# Patient Record
Sex: Female | Born: 1980 | Race: Black or African American | Hispanic: No | Marital: Single | Smoking: Current every day smoker
Health system: Southern US, Community
[De-identification: ages and names within clinical notes are randomized; demographics above are authoritative.]

## PROBLEM LIST (undated history)

## (undated) DIAGNOSIS — R569 Unspecified convulsions: Secondary | ICD-10-CM

## (undated) HISTORY — DX: Unspecified convulsions: R56.9

## (undated) HISTORY — PX: HERNIA REPAIR: SHX51

## (undated) HISTORY — PX: THYROID SURGERY: SHX805

## (undated) HISTORY — PX: TUBAL LIGATION: SHX77

---

## 2004-07-06 ENCOUNTER — Ambulatory Visit: Payer: Self-pay | Admitting: Surgery

## 2004-08-27 ENCOUNTER — Emergency Department: Payer: Self-pay | Admitting: Emergency Medicine

## 2005-09-18 ENCOUNTER — Emergency Department: Payer: Self-pay | Admitting: Emergency Medicine

## 2007-06-22 ENCOUNTER — Emergency Department: Payer: Self-pay | Admitting: Emergency Medicine

## 2007-09-03 ENCOUNTER — Emergency Department: Payer: Self-pay | Admitting: Emergency Medicine

## 2008-04-13 ENCOUNTER — Emergency Department: Payer: Self-pay | Admitting: Emergency Medicine

## 2008-08-11 ENCOUNTER — Emergency Department: Payer: Self-pay | Admitting: Emergency Medicine

## 2008-08-11 ENCOUNTER — Ambulatory Visit: Payer: Self-pay | Admitting: Family Medicine

## 2008-08-17 ENCOUNTER — Ambulatory Visit: Payer: Self-pay | Admitting: Obstetrics and Gynecology

## 2008-08-19 ENCOUNTER — Ambulatory Visit: Payer: Self-pay | Admitting: Family Medicine

## 2008-08-19 ENCOUNTER — Encounter: Payer: Self-pay | Admitting: Obstetrics & Gynecology

## 2008-08-19 LAB — CONVERTED CEMR LAB
Antibody Screen: NEGATIVE
Eosinophils Relative: 0 % (ref 0–5)
Hgb F Quant: 0 % (ref 0.0–2.0)
MCHC: 35.3 g/dL (ref 30.0–36.0)
Monocytes Absolute: 0.7 10*3/uL (ref 0.1–1.0)
RBC: 4.99 M/uL (ref 3.87–5.11)
Rh Type: NEGATIVE

## 2008-08-20 ENCOUNTER — Ambulatory Visit (HOSPITAL_COMMUNITY): Admission: RE | Admit: 2008-08-20 | Discharge: 2008-08-20 | Payer: Self-pay | Admitting: Obstetrics & Gynecology

## 2008-09-02 ENCOUNTER — Encounter: Payer: Self-pay | Admitting: Obstetrics & Gynecology

## 2008-09-02 ENCOUNTER — Encounter: Payer: Self-pay | Admitting: Obstetrics and Gynecology

## 2008-09-02 ENCOUNTER — Ambulatory Visit: Payer: Self-pay | Admitting: Obstetrics and Gynecology

## 2008-09-02 ENCOUNTER — Other Ambulatory Visit: Admission: RE | Admit: 2008-09-02 | Discharge: 2008-09-02 | Payer: Self-pay | Admitting: Obstetrics and Gynecology

## 2008-09-02 LAB — CONVERTED CEMR LAB: Trich, Wet Prep: NONE SEEN

## 2008-09-30 ENCOUNTER — Ambulatory Visit: Payer: Self-pay | Admitting: Family Medicine

## 2008-10-28 ENCOUNTER — Encounter: Payer: Self-pay | Admitting: Obstetrics & Gynecology

## 2008-10-28 ENCOUNTER — Ambulatory Visit: Payer: Self-pay | Admitting: Obstetrics and Gynecology

## 2008-11-11 ENCOUNTER — Ambulatory Visit (HOSPITAL_COMMUNITY): Admission: RE | Admit: 2008-11-11 | Discharge: 2008-11-11 | Payer: Self-pay | Admitting: Obstetrics & Gynecology

## 2008-11-25 ENCOUNTER — Ambulatory Visit: Payer: Self-pay | Admitting: Family Medicine

## 2008-11-28 ENCOUNTER — Emergency Department: Payer: Self-pay | Admitting: Emergency Medicine

## 2008-12-16 ENCOUNTER — Encounter: Payer: Self-pay | Admitting: Obstetrics & Gynecology

## 2008-12-16 ENCOUNTER — Ambulatory Visit: Payer: Self-pay | Admitting: Obstetrics & Gynecology

## 2008-12-16 LAB — CONVERTED CEMR LAB: GC Probe Amp, Genital: NEGATIVE

## 2008-12-17 ENCOUNTER — Encounter: Payer: Self-pay | Admitting: Obstetrics & Gynecology

## 2008-12-17 LAB — CONVERTED CEMR LAB
Clue Cells Wet Prep HPF POC: NONE SEEN
Trich, Wet Prep: NONE SEEN

## 2009-01-13 ENCOUNTER — Ambulatory Visit: Payer: Self-pay | Admitting: Obstetrics and Gynecology

## 2009-01-21 ENCOUNTER — Ambulatory Visit: Payer: Self-pay | Admitting: Obstetrics & Gynecology

## 2009-01-21 ENCOUNTER — Encounter: Payer: Self-pay | Admitting: Obstetrics & Gynecology

## 2009-01-21 LAB — CONVERTED CEMR LAB
HCT: 30.7 % — ABNORMAL LOW (ref 36.0–46.0)
Hemoglobin: 10.1 g/dL — ABNORMAL LOW (ref 12.0–15.0)
MCHC: 32.9 g/dL (ref 30.0–36.0)
Platelets: 258 10*3/uL (ref 150–400)
RBC: 3.45 M/uL — ABNORMAL LOW (ref 3.87–5.11)
RDW: 14.6 % (ref 11.5–15.5)

## 2009-02-04 ENCOUNTER — Observation Stay: Payer: Self-pay

## 2009-02-23 ENCOUNTER — Inpatient Hospital Stay (HOSPITAL_COMMUNITY): Admission: AD | Admit: 2009-02-23 | Discharge: 2009-02-23 | Payer: Self-pay | Admitting: Obstetrics & Gynecology

## 2009-03-03 ENCOUNTER — Ambulatory Visit: Payer: Self-pay | Admitting: Obstetrics & Gynecology

## 2009-03-09 ENCOUNTER — Ambulatory Visit: Payer: Self-pay | Admitting: Obstetrics & Gynecology

## 2009-03-09 LAB — CONVERTED CEMR LAB
Chlamydia, DNA Probe: NEGATIVE
GC Probe Amp, Genital: NEGATIVE

## 2009-03-10 ENCOUNTER — Encounter: Payer: Self-pay | Admitting: Obstetrics & Gynecology

## 2009-03-10 ENCOUNTER — Ambulatory Visit: Payer: Self-pay | Admitting: Family

## 2009-03-10 ENCOUNTER — Inpatient Hospital Stay (HOSPITAL_COMMUNITY): Admission: AD | Admit: 2009-03-10 | Discharge: 2009-03-10 | Payer: Self-pay | Admitting: Obstetrics & Gynecology

## 2009-03-11 ENCOUNTER — Observation Stay: Payer: Self-pay | Admitting: Obstetrics and Gynecology

## 2009-03-11 ENCOUNTER — Inpatient Hospital Stay (HOSPITAL_COMMUNITY): Admission: AD | Admit: 2009-03-11 | Discharge: 2009-03-11 | Payer: Self-pay | Admitting: Obstetrics & Gynecology

## 2009-03-17 ENCOUNTER — Ambulatory Visit: Payer: Self-pay | Admitting: Obstetrics and Gynecology

## 2009-03-20 ENCOUNTER — Inpatient Hospital Stay (HOSPITAL_COMMUNITY): Admission: AD | Admit: 2009-03-20 | Discharge: 2009-03-20 | Payer: Self-pay | Admitting: Obstetrics & Gynecology

## 2009-03-22 ENCOUNTER — Inpatient Hospital Stay: Payer: Self-pay

## 2011-01-09 LAB — RAPID URINE DRUG SCREEN, HOSP PERFORMED
Amphetamines: NOT DETECTED
Tetrahydrocannabinol: POSITIVE — AB

## 2011-01-09 LAB — URINALYSIS, ROUTINE W REFLEX MICROSCOPIC
Glucose, UA: NEGATIVE mg/dL
Protein, ur: NEGATIVE mg/dL
Specific Gravity, Urine: 1.01 (ref 1.005–1.030)

## 2011-01-10 LAB — COMPREHENSIVE METABOLIC PANEL
ALT: 12 U/L (ref 0–35)
AST: 23 U/L (ref 0–37)
Albumin: 3.1 g/dL — ABNORMAL LOW (ref 3.5–5.2)
Alkaline Phosphatase: 110 U/L (ref 39–117)
BUN: 1 mg/dL — ABNORMAL LOW (ref 6–23)
CO2: 23 mEq/L (ref 19–32)
Calcium: 8.7 mg/dL (ref 8.4–10.5)
Chloride: 102 mEq/L (ref 96–112)
Creatinine, Ser: 0.42 mg/dL (ref 0.4–1.2)
GFR calc Af Amer: >60 mL/min (ref >60)
GFR calc non Af Amer: >60 mL/min (ref >60)
Glucose, Bld: 71 mg/dL (ref 70–99)
Potassium: 2.9 mEq/L — ABNORMAL LOW (ref 3.5–5.1)
Sodium: 135 mEq/L (ref 135–145)
Total Bilirubin: 0.8 mg/dL (ref 0.3–1.2)
Total Protein: 5.8 g/dL — ABNORMAL LOW (ref 6.0–8.3)

## 2011-01-10 LAB — WET PREP, GENITAL
Clue Cells Wet Prep HPF POC: NONE SEEN
Trich, Wet Prep: NONE SEEN
Yeast Wet Prep HPF POC: NONE SEEN

## 2011-01-10 LAB — CBC
HCT: 30.1 % — ABNORMAL LOW (ref 36.0–46.0)
Hemoglobin: 10.7 g/dL — ABNORMAL LOW (ref 12.0–15.0)
MCHC: 35.4 g/dL (ref 30.0–36.0)
MCV: 91.4 fL (ref 78.0–100.0)
RDW: 13.8 % (ref 11.5–15.5)
WBC: 7.1 10*3/uL (ref 4.0–10.5)

## 2011-01-10 LAB — URINALYSIS, ROUTINE W REFLEX MICROSCOPIC
Glucose, UA: NEGATIVE mg/dL
Hgb urine dipstick: NEGATIVE
Ketones, ur: >80 mg/dL — AB
Nitrite: NEGATIVE
Protein, ur: NEGATIVE mg/dL
Specific Gravity, Urine: 1.01 (ref 1.005–1.030)
Urobilinogen, UA: 1 mg/dL (ref 0.0–1.0)
pH: 7 (ref 5.0–8.0)

## 2011-01-10 LAB — URINE MICROSCOPIC-ADD ON

## 2011-02-14 NOTE — Assessment & Plan Note (Signed)
NAMECRICKETT, ABBETT               ACCOUNT NO.:  [id number]   MEDICAL RECORD NO.:  [id number]          PATIENT TYPE:  POB   LOCATION:  [facility] at [facility]         FACILITY:  [facility]   PHYSICIAN:  Johnella Moloney, MD        DATE OF BIRTH:  1981-09-24   DATE OF SERVICE:  12/16/2008                                  CLINIC NOTE   HISTORY OF PRESENT ILLNESS:  The patient is a 30 year old gravida 4,  para 3-0-0-3 at 23-5/7 weeks who is followed here at this [facility].  The patient has had an uncomplicated pregnancy test  thus far, and she reported to clinic this week with a history of  fainting 3 times in the last week and also having tightening of her  abdomen with episodes of bleed, and the last one was about a week ago.  The patient also complained that she has bumps in the back of her  tongue, which she thought was caused by a scopolamine patch that was  given to her many weeks ago for her nausea.  She wants to know what she  can do for these bumps to the back of a tongue and also wants further  workup for her syncopal episodes and bleeding.  The patient denies any  other complaints.  She is very frustrated, but she reports she has been  seen by multiple providers and no one is letting her know what is going  on.  The patient's says this baby is going to die.  I feel it and she  reports that this pregnancy feels very different from her other 3  pregnancies and says that she is a very measurable during this  pregnancy.  She said again I will kill this baby before it kills me.  The patient is very irritated and very frustrated at everybody.  On  evaluation, the patient had normal vital signs.  Fetal heart rate was  148.  Fundal height was appropriate for gestational age.  Her weight is  218.  She has trace protein in her urine.  Blood pressure is 117/73.  Of  note, the patient did report that she was having extreme weight loss.  However, on evaluation of her records her  baseline weight was 220 pounds  and the patient has been staying around this baseline for her several  visits.  She was reassured that the baby is measuring appropriately and  that we would that there is no concerned, about her weight remaining the  same.   PHYSICAL EXAMINATION:  I was unable to palpate any abdominal pain.  On  speculum examination, she had pink well rugated vagina.  No lesions  seen.  There was some thick yellow discharge that was noted and wet prep  sample was obtained.  A gonorrhea and Chlamydia sample was also  obtained.  Cervix is noted to be visually closed and long.   ASSESSMENT AND PLAN:  The patient is a 30 year old gravida 4, para 3 at  74 and 5 weeks who comes in for routine OB visit for several complaints.  1. Fainting.  The patient was told that sometimes fainting could be  caused by dehydration and inadequate nutrition; however, given that      this has been a recurrent thing, it was recommended that the      patient undergo an EKG and other workup for her syncopal episodes.      It is also interesting to note that the patient left prior to the      time that the workup could be initiated and was just very      frustrated.  I was unable to receive any referral or labs for      workup of her syncope.  2. Episodes of bleeding.  The patient has no etiology that was seen on      examination.  However, a wet prep and gonorrhea and Chlamydia will      be sent and followed up for possible cervicitis or vaginitis.  She      has no signs or symptoms of preterm labor.  The patient was      strongly urged to go to the hospital to be evaluated for her      bleeding if her bleeding occurs.  The patient says I refuse to go      to the hospital here in [place] and when she was asked why she      did not want to go to [facility], she said that, that was too      far away and that she called answering service many times and she      was told not to come in.   The patient was strongly advised to      follow up [facility] for any further bleeding or syncopal      episodes or any other obstetric concerns.  3. Bumps in the back of her tongue.  I examined these bumps in the      back of her tongue, they look like a normal papilla that are      present to the back of a normal tongue; however, the patient      insists that these happen as a result of scopolamine patch.  She      was told that if she wanted further evaluation, she will have to be      referred to an ENT specialist.  Again, the patient did not stay to      complete this referral and left in a frustrated state.  4. Possible psychiatric disorder, given her attitude and what she      said.  The patient is very concerning for possible depression or      bipolar syndrome.  She did not note any suicidal ideation.      However, I am worried about the fetus and her saying that she will      kill this baby before this baby kills her.  The patient did leave      the office against medical advice before any referral could be done      for her for a mental health specialist or with social work.  I am      very concerned about this patient state of mind and very concerned      about her mental stability and my recommendation would be when she      comes back here that she is evaluated by either a Child psychotherapist or      mental health specialist and I do recommend she does continue to  get obstetric health care management.  Her pregnancy as I said also      has been uncomplicated.  She had normal anatomy scan on November 11, 2008 and the rest of her labs are unremarkable except for her      being Rh negative.           ______________________________  Johnella Moloney, MD     UD/MEDQ  D:  12/16/2008  T:  12/17/2008  Job:  [id number]

## 2011-03-22 ENCOUNTER — Emergency Department: Payer: Self-pay | Admitting: *Deleted

## 2011-06-28 ENCOUNTER — Emergency Department: Payer: Self-pay | Admitting: Emergency Medicine

## 2012-08-27 ENCOUNTER — Emergency Department: Payer: Self-pay | Admitting: Emergency Medicine

## 2013-01-10 ENCOUNTER — Emergency Department: Payer: Self-pay | Admitting: Emergency Medicine

## 2015-03-18 ENCOUNTER — Emergency Department
Admission: EM | Admit: 2015-03-18 | Discharge: 2015-03-18 | Disposition: A | Discharge status: Home / Self Care | Payer: Self-pay | Attending: Emergency Medicine | Admitting: Emergency Medicine

## 2015-03-18 ENCOUNTER — Encounter: Payer: Self-pay | Admitting: Emergency Medicine

## 2015-03-18 ENCOUNTER — Emergency Department: Payer: Self-pay

## 2015-03-18 DIAGNOSIS — S8012XA Contusion of left lower leg, initial encounter: Secondary | ICD-10-CM | POA: Insufficient documentation

## 2015-03-18 DIAGNOSIS — M25461 Effusion, right knee: Secondary | ICD-10-CM | POA: Insufficient documentation

## 2015-03-18 DIAGNOSIS — Y998 Other external cause status: Secondary | ICD-10-CM | POA: Insufficient documentation

## 2015-03-18 DIAGNOSIS — S0083XA Contusion of other part of head, initial encounter: Secondary | ICD-10-CM | POA: Insufficient documentation

## 2015-03-18 DIAGNOSIS — Y9289 Other specified places as the place of occurrence of the external cause: Secondary | ICD-10-CM | POA: Insufficient documentation

## 2015-03-18 DIAGNOSIS — S8002XA Contusion of left knee, initial encounter: Secondary | ICD-10-CM | POA: Insufficient documentation

## 2015-03-18 DIAGNOSIS — Y9389 Activity, other specified: Secondary | ICD-10-CM | POA: Insufficient documentation

## 2015-03-18 DIAGNOSIS — Z720 Tobacco use: Secondary | ICD-10-CM | POA: Insufficient documentation

## 2015-03-18 MED ORDER — OXYCODONE-ACETAMINOPHEN 5-325 MG PO TABS: 1.0000 | ORAL_TABLET | Freq: Three times a day (TID) | ORAL | Status: DC | PRN

## 2015-03-18 MED ORDER — KETOROLAC TROMETHAMINE 10 MG PO TABS: 10.0000 mg | ORAL_TABLET | Freq: Three times a day (TID) | ORAL | Status: DC

## 2015-03-18 MED ORDER — TRAMADOL HCL 50 MG PO TABS: 50.0000 mg | ORAL_TABLET | Freq: Two times a day (BID) | ORAL | Status: DC

## 2015-03-18 MED ORDER — KETOROLAC TROMETHAMINE 10 MG PO TABS: 20.0000 mg | ORAL_TABLET | Freq: Once | ORAL | Status: DC

## 2015-03-18 MED ORDER — LIDOCAINE HCL (PF) 1 % IJ SOLN
INTRAMUSCULAR | Status: AC
  Filled 2015-03-18: qty 5

## 2015-03-18 NOTE — ED Notes (Signed)
AAOx3.  Skin warm and dry.  Verbalizes understanding of discharge instructions.
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
## 2015-03-18 NOTE — Discharge Instructions (Signed)
Contusion A contusion is a deep bruise. Contusions are the result of an injury that caused bleeding under the skin. The contusion may turn blue, purple, or yellow. Minor injuries will give you a painless contusion, but more severe contusions may stay painful and swollen for a few weeks.  CAUSES  A contusion is usually caused by a blow, trauma, or direct force to an area of the body. SYMPTOMS   Swelling and redness of the injured area.  Bruising of the injured area.  Tenderness and soreness of the injured area.  Pain. DIAGNOSIS  The diagnosis can be made by taking a history and physical exam. An X-ray, CT scan, or MRI may be needed to determine if there were any associated injuries, such as fractures. TREATMENT  Specific treatment will depend on what area of the body was injured. In general, the best treatment for a contusion is resting, icing, elevating, and applying cold compresses to the injured area. Over-the-counter medicines may also be recommended for pain control. Ask your caregiver what the best treatment is for your contusion. HOME CARE INSTRUCTIONS   Put ice on the injured area.  Put ice in a plastic bag.  Place a towel between your skin and the bag.  Leave the ice on for 15-20 minutes, 3-4 times a day, or as directed by your health care provider.  Only take over-the-counter or prescription medicines for pain, discomfort, or fever as directed by your caregiver. Your caregiver may recommend avoiding anti-inflammatory medicines (aspirin, ibuprofen, and naproxen) for 48 hours because these medicines may increase bruising.  Rest the injured area.  If possible, elevate the injured area to reduce swelling. SEEK IMMEDIATE MEDICAL CARE IF:   You have increased bruising or swelling.  You have pain that is getting worse.  Your swelling or pain is not relieved with medicines. MAKE SURE YOU:   Understand these instructions.  Will watch your condition.  Will get help right  away if you are not doing well or get worse. Document Released: 06/28/2005 Document Revised: 09/23/2013 Document Reviewed: 07/24/2011 [facility] Patient Information 2015 [place], [place]. This information is not intended to replace advice given to you by your health care provider. Make sure you discuss any questions you have with your health care provider.  Knee Effusion  Knee effusion means you have fluid in your knee. The knee may be more difficult to bend and move. HOME CARE  Use crutches or a brace as told by your doctor.  Put ice on the injured area.  Put ice in a plastic bag.  Place a towel between your skin and the bag.  Leave the ice on for 15-20 minutes, 03-04 times a day.  Raise (elevate) your knee as much as possible.  Only take medicine as told by your doctor.  You may need to do strengthening exercises. Ask your doctor.  Continue with your normal diet and activities as told by your doctor. GET HELP RIGHT AWAY IF:  You have more puffiness (swelling) in your knee.  You see redness, puffiness, or have more pain in your knee.  You have a temperature by mouth above 102 F (38.9 C).  You get a rash.  You have trouble breathing.  You have a reaction to any medicine you are taking.  You have a lot of pain when you move your knee. MAKE SURE YOU:  Understand these instructions.  Will watch your condition.  Will get help right away if you are not doing well or get worse.  Document Released: 10/21/2010 Document Revised: 12/11/2011 Document Reviewed: 10/21/2010 [facility] Patient Information 2015 [place], [place]. This information is not intended to replace advice given to you by your health care provider. Make sure you discuss any questions you have with your health care provider.  Knee Arthrocentesis Arthrocentesis is a procedure for removing fluid from a joint. The procedure is used to remove uncomfortable amounts of fluid from a joint or to get a sample of joint fluid  for testing. Testing joint fluid can help your health care provider figure out the cause of the pain or swelling you are having in your joint. Infection or gout, among other conditions, can cause fluid to form in joints, resulting in pain or swelling.  LET YOUR CAREGIVER KNOW ABOUT:   Allergies.  Medications taken including herbs, eye drops, over the counter medications, and creams.  Use of steroids (by mouth or creams).  Previous problems with anesthetics or Novocaine.  History of blood clots (thrombophlebitis).  History of bleeding or blood problems.  Previous surgery.  Other health problems. RISKS AND COMPLICATIONS  A local anesthetic may not numb the area well enough and you may feel some minor discomfort. In rare cases, you may have an allergic reaction to the drug used to numb the skin.  More fluid may form in the joint.  You may develop infection or bleeding. BEFORE THE PROCEDURE  Wash all of the skin around the entire knee area. Try to remove any loose, scaling skin. There is no other specific preparation necessary unless advised otherwise by your caregiver. AFTER THE PROCEDURE   You can go home after the procedure.  You may need to put ice on the joint 15-20 minutes, 03-04 times a day until pain goes away.  You may need to put an elastic bandage on the joint. HOME CARE INSTRUCTIONS   Only take over-the-counter or prescription medicines for pain, discomfort, or fever as directed by your caregiver.  You should avoid stressing the joint. Unless advised otherwise, this includes jogging, bicycling, recreational climbing, hiking, and other activities that would put a lot of pressure on a knee joint.  Laying down and elevating the leg/knee above the level of your heart can help to minimize return of swelling. SEEK MEDICAL CARE IF:   You have repeated or worsening swelling.  There is drainage from the puncture area.  You develop red streaking that extends above or  below the site where the needle had been placed. SEEK IMMEDIATE MEDICAL CARE IF:   You develop a fever.  You have pain that gets worse even though you are taking pain medicine.  The area is red and warm and you have trouble moving the joint. MAKE SURE YOU:   Understand these instructions.  Will watch your condition.  Will get help right away if you are not doing well or get worse. Document Released: 12/07/2006 Document Revised: 12/11/2011 Document Reviewed: 09/02/2007 [facility] Patient Information 2015 [place], [place]. This information is not intended to replace advice given to you by your health care provider. Make sure you discuss any questions you have with your health care provider.  Keep the knee elevated as needed for comfort.  Ice the knee regularly to reduce swelling.  Take the prescription meds as directed for pain and inflammation control.  Follow-up with Dr. Ernest Pine as discussed for continued problems.
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
## 2015-03-22 LAB — BODY FLUID CULTURE
Culture: NO GROWTH
Gram Stain: NONE SEEN

## 2016-01-19 ENCOUNTER — Encounter: Payer: Self-pay | Admitting: Emergency Medicine

## 2016-01-19 ENCOUNTER — Emergency Department
Admission: EM | Admit: 2016-01-19 | Discharge: 2016-01-19 | Disposition: A | Discharge status: Home / Self Care | Payer: Self-pay | Attending: Emergency Medicine | Admitting: Emergency Medicine

## 2016-01-19 DIAGNOSIS — K0889 Other specified disorders of teeth and supporting structures: Secondary | ICD-10-CM | POA: Insufficient documentation

## 2016-01-19 DIAGNOSIS — F17200 Nicotine dependence, unspecified, uncomplicated: Secondary | ICD-10-CM | POA: Insufficient documentation

## 2016-01-19 MED ORDER — IBUPROFEN 800 MG PO TABS: 800.0000 mg | ORAL_TABLET | Freq: Three times a day (TID) | ORAL | Status: DC | PRN

## 2016-01-19 MED ORDER — LIDOCAINE VISCOUS 2 % MT SOLN
15.0000 mL | Freq: Once | OROMUCOSAL | Status: AC
  Administered 2016-01-19: 15 mL via OROMUCOSAL
  Filled 2016-01-19: qty 15

## 2016-01-19 MED ORDER — TRAMADOL HCL 50 MG PO TABS
50.0000 mg | ORAL_TABLET | Freq: Once | ORAL | Status: AC
  Administered 2016-01-19: 50 mg via ORAL
  Filled 2016-01-19: qty 1

## 2016-01-19 MED ORDER — LIDOCAINE VISCOUS 2 % MT SOLN: 5.0000 mL | Freq: Four times a day (QID) | OROMUCOSAL | Status: DC | PRN

## 2016-01-19 MED ORDER — TRAMADOL HCL 50 MG PO TABS: 50.0000 mg | ORAL_TABLET | Freq: Four times a day (QID) | ORAL | Status: AC | PRN

## 2016-01-19 MED ORDER — IBUPROFEN 800 MG PO TABS
800.0000 mg | ORAL_TABLET | Freq: Once | ORAL | Status: AC
  Administered 2016-01-19: 800 mg via ORAL
  Filled 2016-01-19: qty 1

## 2016-01-19 MED ORDER — AMOXICILLIN 500 MG PO CAPS: 500.0000 mg | ORAL_CAPSULE | Freq: Three times a day (TID) | ORAL | Status: DC

## 2016-01-19 NOTE — Discharge Instructions (Signed)
Take medication as directed and follow-up from list of dental clinics provided OPTIONS FOR DENTAL FOLLOW UP CARE  [place] Department of Health and Human Services - Local Safety Net Dental Clinics [email or web address]://www.ncdhhs.gov/dph/oralhealth/services/safetynetclinics.htm   [facility] [phone])  [facility] [phone])  [facility] [phone]  [facility] [phone]  [facility] [phone]) This clinic caters to the indigent population and is on a lottery system. Location: [employer], [address], [place] [facility] Hours: Wednesdays from 6pm - 9pm, patients seen by a lottery system. For dates, call or go to [email or web address] Services: Cleanings, fillings and simple extractions. Payment Options: DENTAL WORK IS FREE OF CHARGE. Bring proof of income or support. Best way to get seen: Arrive at 5:15 pm - this is a lottery, NOT first come/first serve, so arriving earlier will not increase your chances of being seen.     [facility] [phone] Select option 1 for emergencies   Location: [facility], [facility], [address], [place] [facility] Hours: No walk-ins accepted - call the day before to schedule an appointment. Check in times are 9:30 am and 1:30 pm. Services: Simple extractions, temporary fillings, pulpectomy/pulp debridement, uncomplicated abscess drainage. Payment Options: PAYMENT IS DUE AT THE TIME OF SERVICE.  Fee is usually $100-200, additional surgical procedures (e.g. abscess drainage) may be extra. Cash, checks, Visa/MasterCard accepted.  Can file Medicaid if patient is covered for dental - patient should call case worker to check. No discount for [facility] patients. Best way to get seen: MUST call the day before and get onto the schedule. Can usually be seen the next 1-2 days. No  walk-ins accepted.     [facility] [phone]   Location: [facility], [address], [place] Clinic Hours: M, W, Th, F 8am or 1:30pm, Tues 9a or 1:30 - first come/first served. Services: Simple extractions, temporary fillings, uncomplicated abscess drainage.  You do not need to be an [place] resident. Payment Options: PAYMENT IS DUE AT THE TIME OF SERVICE. Dental insurance, otherwise sliding scale - bring proof of income or support. Depending on income and treatment needed, cost is usually $50-200. Best way to get seen: Arrive early as it is first come/first served.     [facility] [phone]   Location: [address] Clinic Hours: Mon-Thu 8a-5p Services: Most basic dental services including extractions and fillings. Payment Options: PAYMENT IS DUE AT THE TIME OF SERVICE. Sliding scale, up to 50% off - bring proof if income or support. Medicaid with dental option accepted. Best way to get seen: Call to schedule an appointment, can usually be seen within 2 weeks OR they will try to see walk-ins - show up at 8a or 2p (you may have to wait).     [facility] [phone] [place] RESIDENTS ONLY   Location: [facility], [address], [place], [place] [id number] Clinic Hours: By appointment only. Monday - Thursday 8am-5pm, Friday 8am-12pm Services: Cleanings, fillings, extractions. Payment Options: PAYMENT IS DUE AT THE TIME OF SERVICE. Cash, Visa or MasterCard. Sliding scale - $30 minimum per service. Best way to get seen: Come in to office, complete packet and make an appointment - need proof of income or support monies for each household member and proof of [place] [place] residence. Usually takes about a month to get in.  [facility] [phone]   Location: [address] [place] Clinic Hours:  Walk-in Urgent Care Dental Services are offered Monday-Friday mornings only. The numbers of emergencies accepted daily is limited to the number of providers available. Maximum 15 - Mondays, Wednesdays & Thursdays Maximum 10 - Tuesdays & Fridays Services: You do not need to be a [facility] resident to be seen for a dental emergency. Emergencies are defined as pain, swelling, abnormal bleeding, or dental trauma. Walkins will receive x-rays if needed. NOTE: Dental cleaning is not an emergency. Payment Options: PAYMENT IS DUE AT THE TIME OF SERVICE. Minimum co-pay is $40.00 for uninsured patients. Minimum co-pay is $3.00 for Medicaid with dental coverage. Dental Insurance is accepted and must be presented at time of visit. Medicare does not cover dental. Forms of payment: Cash, credit card, checks. Best way to get seen: If not previously registered with the clinic, walk-in dental registration begins at 7:15 am and is on a first come/first serve basis. If previously registered with the clinic, call to make an appointment.     [facility] [place] ONLY   Location: [address] [place], [place] Clinic Hours: Mon-Thu 10a-2p Services: Extractions only! Payment Options: FREE (donations accepted) - bring proof of income or support Best way to get seen: Call and schedule an appointment OR come at 8am on the 1st Monday of every month (except for holidays) when it is first come/first served.     [facility] [phone]   Location: [place], [place] Clinic Hours: Friday mornings Services, Payment Options, Best way to get seen: Call for info

## 2016-01-19 NOTE — ED Notes (Signed)
Patient ambulatory to triage with steady gait, without difficulty or distress noted; pt reports left sided upper and lower toothache since yesterday; taking tylenol with no relief; V x1 at 5pm

## 2016-01-19 NOTE — ED Provider Notes (Signed)
[facility] Emergency Department Provider Note  ____________________________________________  Time seen: Approximately 9:35 PM  I have reviewed the triage vital signs and the nursing notes.   HISTORY  Chief Complaint Dental Pain    HPI Meghan Rollins is a 35 y.o. female patient complaining of dental pain for 2 days. Patient has devitalized left upper and lower premolars. Patient stated pain is not relieved taking Tylenol. Patient rated the pain as a 10 over 10. Patient described a pain as sharp. No other palliative measures taken for this complaint.   History reviewed. No pertinent past medical history.  There are no active problems to display for this patient.   Past Surgical History  Procedure Laterality Date  . Hernia repair    . Tubal ligation      Current Outpatient Rx  Name  Route  Sig  Dispense  Refill  . amoxicillin (AMOXIL) 500 MG capsule   Oral   Take 1 capsule (500 mg total) by mouth 3 (three) times daily.   30 capsule   0   . ibuprofen (ADVIL,MOTRIN) 800 MG tablet   Oral   Take 1 tablet (800 mg total) by mouth every 8 (eight) hours as needed.   30 tablet   0   . ketorolac (TORADOL) 10 MG tablet   Oral   Take 1 tablet (10 mg total) by mouth every 8 (eight) hours.   15 tablet   0   . lidocaine (XYLOCAINE) 2 % solution   Mouth/Throat   Use as directed 5 mLs in the mouth or throat every 6 (six) hours as needed for mouth pain.   100 mL   0   . oxyCODONE-acetaminophen (ROXICET) 5-325 MG per tablet   Oral   Take 1 tablet by mouth every 8 (eight) hours as needed.   10 tablet   0   . traMADol (ULTRAM) 50 MG tablet   Oral   Take 1 tablet (50 mg total) by mouth every 6 (six) hours as needed.   20 tablet   0     Allergies Vicodin  No family history on file.  Social History Social History  Substance Use Topics  . Smoking status: Current Every Day Smoker  . Smokeless tobacco: None  . Alcohol Use: No    Review of  Systems Constitutional: No fever/chills Eyes: No visual changes. ENT: No sore throat. Dental pain Cardiovascular: Denies chest pain. Respiratory: Denies shortness of breath. Gastrointestinal: No abdominal pain.  No nausea, no vomiting.  No diarrhea.  No constipation. Genitourinary: Negative for dysuria. Musculoskeletal: Negative for back pain. Skin: Negative for rash. Neurological: Negative for headaches, focal weakness or numbness.   ____________________________________________   PHYSICAL EXAM:  VITAL SIGNS: ED Triage Vitals  Enc Vitals Group     BP 01/19/16 2044 149/105 mmHg     Pulse Rate 01/19/16 2044 56     Resp 01/19/16 2044 16     Temp 01/19/16 2044 98.3 F (36.8 C)     Temp Source 01/19/16 2044 Oral     SpO2 01/19/16 2044 97 %     Weight --      Height --      Head Cir --      Peak Flow --      Pain Score 01/19/16 2048 10     Pain Loc --      Pain Edu? --      Excl. in GC? --     Constitutional: Alert and  oriented. Well appearing and in no acute distress. Eyes: Conjunctivae are normal. PERRL. EOMI. Head: Atraumatic. Nose: No congestion/rhinnorhea. Mouth/Throat: Mucous membranes are moist.  Oropharynx non-erythematous.Devitalized and fractured tooth #4 and #20. Neck: No stridor.  No cervical spine tenderness to palpation. Hematological/Lymphatic/Immunilogical: No cervical lymphadenopathy. Cardiovascular: Normal rate, regular rhythm. Grossly normal heart sounds.  Good peripheral circulation. Elevated blood pressure Respiratory: Normal respiratory effort.  No retractions. Lungs CTAB. Gastrointestinal: Soft and nontender. No distention. No abdominal bruits. No CVA tenderness. Musculoskeletal: No lower extremity tenderness nor edema.  No joint effusions. Neurologic:  Normal speech and language. No gross focal neurologic deficits are appreciated. No gait instability. Skin:  Skin is warm, dry and intact. No rash noted. Psychiatric: Mood and affect are normal.  Speech and behavior are normal.  ____________________________________________   LABS (all labs ordered are listed, but only abnormal results are displayed)  Labs Reviewed - No data to display ____________________________________________  EKG   ____________________________________________  RADIOLOGY   ____________________________________________   PROCEDURES  Procedure(s) performed: None  Critical Care performed: No  ____________________________________________   INITIAL IMPRESSION / ASSESSMENT AND PLAN / ED COURSE  Pertinent labs & imaging results that were available during my care of the patient were reviewed by me and considered in my medical decision making (see chart for details).  Dental pain secondary to fractured teeth. Patient advised to follow-up from list of dental clinics provided. Patient given a prescription for viscous lidocaine, ibuprofen, amoxicillin, and tramadol. ____________________________________________   FINAL CLINICAL IMPRESSION(S) / ED DIAGNOSES  Final diagnoses:  Pain, dental      Joni Reining, PA-C 01/19/16 1610  Maurilio Lovely, MD 01/19/16 2326

## 2017-03-14 ENCOUNTER — Emergency Department: Payer: Self-pay

## 2017-03-14 ENCOUNTER — Emergency Department
Admission: EM | Admit: 2017-03-14 | Discharge: 2017-03-14 | Disposition: A | Discharge status: Home / Self Care | Payer: Self-pay | Attending: Emergency Medicine | Admitting: Emergency Medicine

## 2017-03-14 DIAGNOSIS — R61 Generalized hyperhidrosis: Secondary | ICD-10-CM | POA: Insufficient documentation

## 2017-03-14 DIAGNOSIS — Z791 Long term (current) use of non-steroidal anti-inflammatories (NSAID): Secondary | ICD-10-CM | POA: Insufficient documentation

## 2017-03-14 DIAGNOSIS — R509 Fever, unspecified: Secondary | ICD-10-CM

## 2017-03-14 DIAGNOSIS — F17200 Nicotine dependence, unspecified, uncomplicated: Secondary | ICD-10-CM | POA: Insufficient documentation

## 2017-03-14 DIAGNOSIS — Z792 Long term (current) use of antibiotics: Secondary | ICD-10-CM | POA: Insufficient documentation

## 2017-03-14 DIAGNOSIS — N390 Urinary tract infection, site not specified: Secondary | ICD-10-CM | POA: Insufficient documentation

## 2017-03-14 LAB — COMPREHENSIVE METABOLIC PANEL
ALT: 20 U/L (ref 14–54)
AST: 27 U/L (ref 15–41)
Albumin: 4.1 g/dL (ref 3.5–5.0)
Alkaline Phosphatase: 49 U/L (ref 38–126)
Anion gap: 9 (ref 5–15)
BUN: 8 mg/dL (ref 6–20)
CALCIUM: 9 mg/dL (ref 8.9–10.3)
CO2: 26 mmol/L (ref 22–32)
CREATININE: 0.58 mg/dL (ref 0.44–1.00)
Chloride: 100 mmol/L — ABNORMAL LOW (ref 101–111)
GFR calc Af Amer: >60 mL/min (ref >60)
Glucose, Bld: 102 mg/dL — ABNORMAL HIGH (ref 65–99)
POTASSIUM: 3.1 mmol/L — AB (ref 3.5–5.1)
Sodium: 135 mmol/L (ref 135–145)
TOTAL PROTEIN: 7.4 g/dL (ref 6.5–8.1)
Total Bilirubin: 0.5 mg/dL (ref 0.3–1.2)

## 2017-03-14 LAB — CBC
HEMATOCRIT: 39.1 % (ref 35.0–47.0)
HEMOGLOBIN: 13.8 g/dL (ref 12.0–16.0)
MCH: 32.1 pg (ref 26.0–34.0)
MCHC: 35.3 g/dL (ref 32.0–36.0)
MCV: 90.7 fL (ref 80.0–100.0)
Platelets: 390 10*3/uL (ref 150–440)
RBC: 4.3 MIL/uL (ref 3.80–5.20)
RDW: 15.1 % — AB (ref 11.5–14.5)
WBC: 8.4 10*3/uL (ref 3.6–11.0)

## 2017-03-14 LAB — URINALYSIS, COMPLETE (UACMP) WITH MICROSCOPIC
Bilirubin Urine: NEGATIVE
GLUCOSE, UA: NEGATIVE mg/dL
Ketones, ur: NEGATIVE mg/dL
Leukocytes, UA: NEGATIVE
Nitrite: NEGATIVE
PH: 7 (ref 5.0–8.0)
Protein, ur: NEGATIVE mg/dL
SPECIFIC GRAVITY, URINE: 1.01 (ref 1.005–1.030)

## 2017-03-14 LAB — RAPID HIV SCREEN (HIV 1/2 AB+AG)
HIV 1/2 Antibodies: NONREACTIVE
HIV-1 P24 Antigen - HIV24: NONREACTIVE

## 2017-03-14 MED ORDER — DOXYCYCLINE HYCLATE 50 MG PO CAPS: 100.0000 mg | ORAL_CAPSULE | Freq: Two times a day (BID) | ORAL | 0 refills | Status: AC

## 2017-03-14 MED ORDER — CIPROFLOXACIN HCL 500 MG PO TABS
500.0000 mg | ORAL_TABLET | Freq: Once | ORAL | Status: AC
  Administered 2017-03-14: 500 mg via ORAL
  Filled 2017-03-14: qty 1

## 2017-03-14 NOTE — ED Notes (Signed)
Dr. Brown at the bedside for pt evaluation 

## 2017-03-14 NOTE — ED Notes (Signed)
Pt up to restroom with steady gait. No distress noted. Pt c/o chills at this time.

## 2017-03-14 NOTE — ED Triage Notes (Addendum)
Pt to ED via POV with c/o fever, chills, and nausea x1 day. Pt denies c/o pain, no SHOB, no vomiting, no diarrhea. Pt reports OTC Tylenol and Nyquil taken without relief.

## 2017-03-15 NOTE — ED Provider Notes (Signed)
[facility] Emergency Department Provider Note    First MD Initiated Contact with Patient 03/14/17 80[phone]     (approximate)  I have reviewed the triage vital signs and the nursing notes.   HISTORY  Chief Complaint Fever; Chills; and Nausea    HPI Meghan GreenspanShanetta L Wrobel is a 36 y.o. female with no chronic medical conditions presents to the emergency department with "hot and cold flashes times one day". Patient also admits to nausea. Patient denies any cough no shortness of breath no pain no vomiting diarrhea. Patient does admit to malodorous urine 2 weeks. Patient also admits to night sweats.   Past medical history None There are no active problems to display for this patient.   Past Surgical History:  Procedure Laterality Date  . HERNIA REPAIR    . TUBAL LIGATION      Prior to Admission medications   Medication Sig Start Date End Date Taking? Authorizing Provider  amoxicillin (AMOXIL) 500 MG capsule Take 1 capsule (500 mg total) by mouth 3 (three) times daily. 01/19/16   Joni ReiningSmith, Ronald K, PA-C  doxycycline (VIBRAMYCIN) 50 MG capsule Take 2 capsules (100 mg total) by mouth 2 (two) times daily. 03/14/17 03/24/17  Darci CurrentBrown, Gueydan N, MD  ibuprofen (ADVIL,MOTRIN) 800 MG tablet Take 1 tablet (800 mg total) by mouth every 8 (eight) hours as needed. 01/19/16   Joni ReiningSmith, Ronald K, PA-C  ketorolac (TORADOL) 10 MG tablet Take 1 tablet (10 mg total) by mouth every 8 (eight) hours. 03/18/15   Menshew, Charlesetta IvoryJenise V Bacon, PA-C  lidocaine (XYLOCAINE) 2 % solution Use as directed 5 mLs in the mouth or throat every 6 (six) hours as needed for mouth pain. 01/19/16   Joni ReiningSmith, Ronald K, PA-C  oxyCODONE-acetaminophen (ROXICET) 5-325 MG per tablet Take 1 tablet by mouth every 8 (eight) hours as needed. 03/18/15   Menshew, Charlesetta IvoryJenise V Bacon, PA-C    Allergies Vicodin [hydrocodone-acetaminophen]  No family history on file.  Social History Social History  Substance Use Topics  . Smoking status:  Current Every Day Smoker  . Smokeless tobacco: Never Used  . Alcohol use No    Review of Systems Constitutional: Positive for chills Eyes: No visual changes. ENT: No sore throat. Cardiovascular: Denies chest pain. Respiratory: Denies shortness of breath. Gastrointestinal: No abdominal pain.  No nausea, no vomiting.  No diarrhea.  No constipation. Genitourinary: Negative for dysuria. Musculoskeletal: Negative for neck pain.  Negative for back pain. Integumentary: Negative for rash. Neurological: Negative for headaches, focal weakness or numbness.   ____________________________________________   PHYSICAL EXAM:  VITAL SIGNS: ED Triage Vitals  Enc Vitals Group     BP 03/14/17 0317 (!) 126/97     Pulse Rate 03/14/17 0317 97     Resp 03/14/17 0317 16     Temp 03/14/17 0317 98.5 F (36.9 C)     Temp Source 03/14/17 0317 Oral     SpO2 03/14/17 0317 100 %     Weight 03/14/17 0314 92.1 kg (203 lb)     Height 03/14/17 0314 1.753 m (5\' 9" )     Head Circumference --      Peak Flow --      Pain Score --      Pain Loc --      Pain Edu? --      Excl. in GC? --     Constitutional: Alert and oriented. Well appearing and in no acute distress. Eyes: Conjunctivae are normal. Head: Atraumatic. Ears:  Healthy  appearing ear canals and TMs bilaterally Nose: No congestion/rhinnorhea. Mouth/Throat: Mucous membranes are moist.  Oropharynx non-erythematous. Neck: No stridor.  No meningeal signs.  Cardiovascular: Normal rate, regular rhythm. Good peripheral circulation. Grossly normal heart sounds. Respiratory: Normal respiratory effort.  No retractions. Lungs CTAB. Gastrointestinal: Soft and nontender. No distention.  Musculoskeletal: No lower extremity tenderness nor edema. No gross deformities of extremities. Neurologic:  Normal speech and language. No gross focal neurologic deficits are appreciated.  Skin:  Skin is warm, dry and intact. No rash noted. Psychiatric: Mood and affect are  normal. Speech and behavior are normal.  ____________________________________________   LABS (all labs ordered are listed, but only abnormal results are displayed)  Labs Reviewed  CBC - Abnormal; Notable for the following:       Result Value   RDW 15.1 (*)    All other components within normal limits  COMPREHENSIVE METABOLIC PANEL - Abnormal; Notable for the following:    Potassium 3.1 (*)    Chloride 100 (*)    Glucose, Bld 102 (*)    All other components within normal limits  URINALYSIS, COMPLETE (UACMP) WITH MICROSCOPIC - Abnormal; Notable for the following:    Color, Urine YELLOW (*)    APPearance CLEAR (*)    Hgb urine dipstick MODERATE (*)    Bacteria, UA RARE (*)    Squamous Epithelial / LPF 0-5 (*)    All other components within normal limits  CULTURE, BLOOD (ROUTINE X 2)  CULTURE, BLOOD (ROUTINE X 2)  URINE CULTURE  RAPID HIV SCREEN (HIV 1/2 AB+AG)     Procedures   ____________________________________________   INITIAL IMPRESSION / ASSESSMENT AND PLAN / ED COURSE  Pertinent labs & imaging results that were available during my care of the patient were reviewed by me and considered in my medical decision making (see chart for details).  No clear etiology of the patient's symptoms identified urinalysis revealed WBC 6-30, Rare bacteria       ____________________________________________  FINAL CLINICAL IMPRESSION(S) / ED DIAGNOSES  Final diagnoses:  Urinary tract infection without hematuria, site unspecified  Night sweats  Fever, unspecified fever cause     MEDICATIONS GIVEN DURING THIS VISIT:  Medications  ciprofloxacin (CIPRO) tablet 500 mg (500 mg Oral Given 03/14/17 0652)     NEW OUTPATIENT MEDICATIONS STARTED DURING THIS VISIT:  Discharge Medication List as of 03/14/2017  5:40 AM    START taking these medications   Details  doxycycline (VIBRAMYCIN) 50 MG capsule Take 2 capsules (100 mg total) by mouth 2 (two) times daily., Starting Wed  03/14/2017, Until Sat 03/24/2017, Print        Discharge Medication List as of 03/14/2017  5:40 AM      Discharge Medication List as of 03/14/2017  5:40 AM       Note:  This document was prepared using Dragon voice recognition software and may include unintentional dictation errors.    Darci Current, MD 03/19/17 [phone]

## 2017-03-19 LAB — CULTURE, BLOOD (ROUTINE X 2)
Culture: NO GROWTH
Culture: NO GROWTH
SPECIAL REQUESTS: ADEQUATE
Special Requests: ADEQUATE

## 2017-05-23 ENCOUNTER — Emergency Department
Admission: EM | Admit: 2017-05-23 | Discharge: 2017-05-23 | Disposition: A | Discharge status: Home / Self Care | Payer: Self-pay | Attending: Emergency Medicine | Admitting: Emergency Medicine

## 2017-05-23 ENCOUNTER — Encounter: Payer: Self-pay | Admitting: Emergency Medicine

## 2017-05-23 DIAGNOSIS — K0889 Other specified disorders of teeth and supporting structures: Secondary | ICD-10-CM

## 2017-05-23 DIAGNOSIS — K047 Periapical abscess without sinus: Secondary | ICD-10-CM | POA: Insufficient documentation

## 2017-05-23 DIAGNOSIS — K029 Dental caries, unspecified: Secondary | ICD-10-CM | POA: Insufficient documentation

## 2017-05-23 MED ORDER — ETODOLAC 200 MG PO CAPS: 200.0000 mg | ORAL_CAPSULE | Freq: Three times a day (TID) | ORAL | 0 refills | Status: DC

## 2017-05-23 MED ORDER — CEPHALEXIN 500 MG PO CAPS
500.0000 mg | ORAL_CAPSULE | Freq: Once | ORAL | Status: AC
  Administered 2017-05-23: 500 mg via ORAL
  Filled 2017-05-23: qty 1

## 2017-05-23 MED ORDER — CEPHALEXIN 500 MG PO CAPS: 500.0000 mg | ORAL_CAPSULE | Freq: Four times a day (QID) | ORAL | 0 refills | Status: AC

## 2017-05-23 MED ORDER — KETOROLAC TROMETHAMINE 30 MG/ML IJ SOLN
30.0000 mg | Freq: Once | INTRAMUSCULAR | Status: AC
  Administered 2017-05-23: 30 mg via INTRAVENOUS
  Filled 2017-05-23: qty 1

## 2017-05-23 NOTE — ED Provider Notes (Signed)
[facility] Emergency Department Provider Note   ____________________________________________   First MD Initiated Contact with Patient 05/23/17 0510     (approximate)  I have reviewed the triage vital signs and the nursing notes.   HISTORY  Chief Complaint Dental Pain    HPI Meghan Rollins is a 36 y.o. female Who comes into the hospital today with a toothache. She reports that she has a tooth that needed to come out ages ago The patient states that she did not have the money to remove it. The patient states that she has been putting some putty on it but it fell off and the entire tooth became exposed. She placed putty on it again but the tooth started to hurt. She states that the gums around her tooth hurts. She states that it is also swollen and she feels a lump in her jaw. The patient has been taking Tylenol for her symptoms. She states that she has some mild facial swelling. The patient denies any fever. She has a dental appointment on Thursday but could not hold out any longer so she came here.he patient ratin intensity at this time.   History reviewed. No pertinent past medical history.  There are no active problems to display for this patient.   Past Surgical History:  Procedure Laterality Date  . HERNIA REPAIR    . TUBAL LIGATION      Prior to Admission medications   Medication Sig Start Date End Date Taking? Authorizing Provider  amoxicillin (AMOXIL) 500 MG capsule Take 1 capsule (500 mg total) by mouth 3 (three) times daily. 01/19/16   Joni Reining, PA-C  cephALEXin (KEFLEX) 500 MG capsule Take 1 capsule (500 mg total) by mouth 4 (four) times daily. 05/23/17 06/02/17  Rebecka Apley, MD  etodolac (LODINE) 200 MG capsule Take 1 capsule (200 mg total) by mouth every 8 (eight) hours. 05/23/17   Rebecka Apley, MD  ibuprofen (ADVIL,MOTRIN) 800 MG tablet Take 1 tablet (800 mg total) by mouth every 8 (eight) hours as needed. 01/19/16   Joni Reining, PA-C  ketorolac (TORADOL) 10 MG tablet Take 1 tablet (10 mg total) by mouth every 8 (eight) hours. 03/18/15   Menshew, Charlesetta Ivory, PA-C  lidocaine (XYLOCAINE) 2 % solution Use as directed 5 mLs in the mouth or throat every 6 (six) hours as needed for mouth pain. 01/19/16   Joni Reining, PA-C  oxyCODONE-acetaminophen (ROXICET) 5-325 MG per tablet Take 1 tablet by mouth every 8 (eight) hours as needed. 03/18/15   Menshew, Charlesetta Ivory, PA-C    Allergies Vicodin [hydrocodone-acetaminophen]  No family history on file.  Social History Social History  Substance Use Topics  . Smoking status: Current Every Day Smoker  . Smokeless tobacco: Never Used  . Alcohol use Yes     Comment: occ    Review of Systems  Constitutional: No fever/chills Eyes: No visual changes. ENT: dental pain. Cardiovascular: Denies chest pain. Respiratory: Denies shortness of breath. Gastrointestinal: No abdominal pain.  No nausea, no vomiting.  No diarrhea.  No constipation. Genitourinary: Negative for dysuria. Musculoskeletal: Negative for back pain. Skin: Negative for rash. Neurological: Negative for headaches, focal weakness or numbness.   ____________________________________________   PHYSICAL EXAM:  VITAL SIGNS: ED Triage Vitals [05/23/17 0500]  Enc Vitals Group     BP (!) 155/102     Pulse Rate 80     Resp 18     Temp 97.6 F (  36.4 C)     Temp Source Oral     SpO2 100 %     Weight 197 lb (89.4 kg)     Height 5\' 9"  (1.753 m)     Head Circumference      Peak Flow      Pain Score 10     Pain Loc      Pain Edu?      Excl. in GC?     Constitutional: Alert and oriented. Well appearing and in mild distress. Eyes: Conjunctivae are normal. PERRL. EOMI. Head: Atraumatic. Nose: No congestion/rhinnorhea. Mouth/Throat: Mucous membranes are moist.  Oropharynx non-erythematous. Poor dentition left lower mandibular molar with some tenderness to palpation and swelling of the gums. No  significant swelling noted to the left face but lesion palpated at the jawline. Cardiovascular: Normal rate, regular rhythm. Grossly normal heart sounds.  Good peripheral circulation. Respiratory: Normal respiratory effort.  No retractions. Lungs CTAB. Gastrointestinal: Soft and nontender. No distention. Positive bowel sounds Musculoskeletal: No lower extremity tenderness nor edema.   Neurologic:  Normal speech and language. Skin:  Skin is warm, dry and intact.  Psychiatric: Mood and affect are normal.   ____________________________________________   LABS (all labs ordered are listed, but only abnormal results are displayed)  Labs Reviewed - No data to display ____________________________________________  EKG  none ____________________________________________  RADIOLOGY  No results found.  ____________________________________________   PROCEDURES  Procedure(s) performed: None  Procedures  Critical Care performed: No  ____________________________________________   INITIAL IMPRESSION / ASSESSMENT AND PLAN / ED COURSE  Pertinent labs & imaging results that were available during my care of the patient were reviewed by me and considered in my medical decision making (see chart for details).  This is a 36 year old female who comes into the hospital today with dental pain. The patient has some soft tissue swelling to her gums and pain to palpation of the mandibular molar on the left. The patient is missing multiple teeth are I am unable to determine which tooth. The patient will receive a dose of Toradol and Keflex. She'll be discharged to home and encouraged to follow up with her dentist on Thursday as she states she has scheduled.      ____________________________________________   FINAL CLINICAL IMPRESSION(S) / ED DIAGNOSES  Final diagnoses:  Pain, dental  Dental caries  Dental abscess      NEW MEDICATIONS STARTED DURING THIS VISIT:  New Prescriptions    CEPHALEXIN (KEFLEX) 500 MG CAPSULE    Take 1 capsule (500 mg total) by mouth 4 (four) times daily.   ETODOLAC (LODINE) 200 MG CAPSULE    Take 1 capsule (200 mg total) by mouth every 8 (eight) hours.     Note:  This document was prepared using Dragon voice recognition software and may include unintentional dictation errors.    Rebecka Apley, MD 05/23/17 [phone]

## 2017-05-23 NOTE — ED Triage Notes (Signed)
Patient with complaint of left lower dental pain that started yesterday.

## 2017-05-24 ENCOUNTER — Encounter: Payer: Self-pay | Admitting: Emergency Medicine

## 2017-05-24 ENCOUNTER — Emergency Department
Admission: EM | Admit: 2017-05-24 | Discharge: 2017-05-24 | Disposition: A | Discharge status: Home / Self Care | Payer: Self-pay | Attending: Emergency Medicine | Admitting: Emergency Medicine

## 2017-05-24 DIAGNOSIS — F17200 Nicotine dependence, unspecified, uncomplicated: Secondary | ICD-10-CM | POA: Insufficient documentation

## 2017-05-24 DIAGNOSIS — K0889 Other specified disorders of teeth and supporting structures: Secondary | ICD-10-CM | POA: Insufficient documentation

## 2017-05-24 DIAGNOSIS — K047 Periapical abscess without sinus: Secondary | ICD-10-CM | POA: Insufficient documentation

## 2017-05-24 DIAGNOSIS — Z79899 Other long term (current) drug therapy: Secondary | ICD-10-CM | POA: Insufficient documentation

## 2017-05-24 DIAGNOSIS — K029 Dental caries, unspecified: Secondary | ICD-10-CM | POA: Insufficient documentation

## 2017-05-24 MED ORDER — LIDOCAINE-EPINEPHRINE 2 %-1:100000 IJ SOLN
INTRAMUSCULAR | Status: AC
  Administered 2017-05-24: 20:00:00
  Filled 2017-05-24: qty 1.7

## 2017-05-24 MED ORDER — BUPIVACAINE HCL (PF) 0.25 % IJ SOLN
INTRAMUSCULAR | Status: AC
  Administered 2017-05-24: 20:00:00
  Filled 2017-05-24: qty 30

## 2017-05-24 MED ORDER — TRAMADOL HCL 50 MG PO TABS
50.0000 mg | ORAL_TABLET | Freq: Once | ORAL | Status: AC
  Administered 2017-05-24: 50 mg via ORAL
  Filled 2017-05-24: qty 1

## 2017-05-24 MED ORDER — LIDOCAINE VISCOUS 2 % MT SOLN
15.0000 mL | Freq: Once | OROMUCOSAL | Status: AC
  Administered 2017-05-24: 15 mL via OROMUCOSAL
  Filled 2017-05-24: qty 15

## 2017-05-24 MED ORDER — TRAMADOL HCL 50 MG PO TABS: 50.0000 mg | ORAL_TABLET | Freq: Four times a day (QID) | ORAL | 0 refills | Status: DC | PRN

## 2017-05-24 NOTE — ED Provider Notes (Signed)
[facility] Emergency Department Provider Note  Time seen: 8:05 PM  I have reviewed the triage vital signs and the nursing notes.   HISTORY  Chief Complaint Dental Pain    HPI Meghan Rollins is a 36 y.o. female with no past medical history who presents to the emergency department for left lower jaw/molar pain. According to the patient yesterday she awoke with left lower jaw pain. Patient was seen in the emergency department for significant discomfort. She was prescribed antibiotics and pain medication. She was dental appointment this morning was seen by dentist was told to continue the antibiotics into the swelling had decreased and then she would see the patient back in the office for a dental extraction. Patient states she continues to have significant pain so she returned to the emergency department tonight. Patient states she does not want any more pain medication because it makes her somewhat nauseated and dizzy. Patient does have mild left jaw swelling.  History reviewed. No pertinent past medical history.  There are no active problems to display for this patient.   Past Surgical History:  Procedure Laterality Date  . HERNIA REPAIR    . TUBAL LIGATION      Prior to Admission medications   Medication Sig Start Date End Date Taking? Authorizing Provider  amoxicillin (AMOXIL) 500 MG capsule Take 1 capsule (500 mg total) by mouth 3 (three) times daily. 01/19/16   Joni Reining, PA-C  cephALEXin (KEFLEX) 500 MG capsule Take 1 capsule (500 mg total) by mouth 4 (four) times daily. 05/23/17 06/02/17  Rebecka Apley, MD  etodolac (LODINE) 200 MG capsule Take 1 capsule (200 mg total) by mouth every 8 (eight) hours. 05/23/17   Rebecka Apley, MD  ibuprofen (ADVIL,MOTRIN) 800 MG tablet Take 1 tablet (800 mg total) by mouth every 8 (eight) hours as needed. 01/19/16   Joni Reining, PA-C  ketorolac (TORADOL) 10 MG tablet Take 1 tablet (10 mg total) by mouth  every 8 (eight) hours. 03/18/15   Menshew, Charlesetta Ivory, PA-C  lidocaine (XYLOCAINE) 2 % solution Use as directed 5 mLs in the mouth or throat every 6 (six) hours as needed for mouth pain. 01/19/16   Joni Reining, PA-C  oxyCODONE-acetaminophen (ROXICET) 5-325 MG per tablet Take 1 tablet by mouth every 8 (eight) hours as needed. 03/18/15   Menshew, Charlesetta Ivory, PA-C  traMADol (ULTRAM) 50 MG tablet Take 1 tablet (50 mg total) by mouth every 6 (six) hours as needed. 05/24/17   Irean Hong, MD    Allergies  Allergen Reactions  . Vicodin [Hydrocodone-Acetaminophen] Anaphylaxis    History reviewed. No pertinent family history.  Social History Social History  Substance Use Topics  . Smoking status: Current Every Day Smoker  . Smokeless tobacco: Never Used  . Alcohol use Yes     Comment: occ    Review of Systems Constitutional: Negative for fever. Cardiovascular: Negative for chest pain. Respiratory: Negative for shortness of breath. Gastrointestinal: Negative for abdominal pain Neurological: Negative for headache All other ROS negative  ____________________________________________   PHYSICAL EXAM:  VITAL SIGNS: ED Triage Vitals  Enc Vitals Group     BP 05/24/17 1729 (!) 155/102     Pulse Rate 05/24/17 1729 86     Resp 05/24/17 1729 20     Temp 05/24/17 1729 98 F (36.7 C)     Temp Source 05/24/17 1729 Oral     SpO2 05/24/17 1729 98 %  Weight --      Height --      Head Circumference --      Peak Flow --      Pain Score 05/24/17 1745 10     Pain Loc --      Pain Edu? --      Excl. in GC? --     Constitutional: Alert and oriented. Well appearing and in no distress. Eyes: Normal exam ENT   Head: Normocephalic and atraumatic   Mouth/Throat: Mucous membranes are moist.Mild left lower jaw/facial swelling, with moderate tenderness to palpation to this area. Poor dentition overall, in the area of concern, left lower molar patient does have tenderness to the  base of the tooth without obvious abscess. Floor the mouth is soft. Cardiovascular: Normal rate, regular rhythm. No murmurs, rubs, or gallops. Respiratory: Normal respiratory effort without tachypnea nor retractions. Breath sounds are clear Gastrointestinal: Soft and nontender. No distention.   Musculoskeletal: Nontender with normal range of motion in all extremities. Neurologic:  Normal speech and language. No gross focal neurologic deficits Skin:  Skin is warm, dry and intact.  Psychiatric: Mood and affect are normal.  ____________________________________________   INITIAL IMPRESSION / ASSESSMENT AND PLAN / ED COURSE  Pertinent labs & imaging results that were available during my care of the patient were reviewed by me and considered in my medical decision making (see chart for details).  Patient presents to the emergency department for left lower jaw pain and swelling. Patient is currently on antibiotics for dental pain/infection. Patient has seen a dentist today and was told to come back once the swelling had gone down in several days for tooth extraction. They used a bedside ultrasound and could not identify a fluid pocket there is soft tissue swelling most consistent with cellulitis/early abscess but no significant pocket for drainage at this time. As the patient did not want any further pain medication I performed an inferior alveolar nerve block with lidocaine as well as bupivacaine for the patient. She states significant relief from pain after the block. I discussed with patient the need to continue to take Tylenol for discomfort as well is to continue her antibiotics and follow-up with her dentist as soon as possible. The patient is agreeable to this plan.  ____________________________________________   FINAL CLINICAL IMPRESSION(S) / ED DIAGNOSES  Dental pain    Minna Antis, MD 05/24/17 2008

## 2017-05-24 NOTE — Discharge Instructions (Signed)
You may take tramadol as needed for pain. Continue antibiotic and anti-inflammatory as previously prescribed. Return to the ER for worsening symptoms, persistent vomiting, fever, increased swelling or other concerns.

## 2017-05-24 NOTE — Discharge Instructions (Signed)
Please follow-up with your dentist as soon as possible for further evaluation and possible dental extraction. Return to the emergency department for any significant worsening of pain, fever, significant swelling or any other symptom personally concerning to yourself.

## 2017-05-24 NOTE — ED Triage Notes (Signed)
Pt here yesterday and now twice today for dental pain and facial swelling. States that nothing she has received has helped. When to the dentist today and was told to continue antibiotics and then tooth could be pulled.

## 2017-05-24 NOTE — ED Notes (Addendum)
Pt states that this is her 3rd time here to ED since yesterday for tooth pain on her left side. Pt jaw is swollen and she is teary eyed. Pt was given antibiotics for the toothache. Pt also reports feeling dizzy and she is not sure why. Pt also states that she went to dentist today and they told her that they couldn't do anything with how swollen her jaw was and that she needed to continue taking the Antibiotics until some of the swelling went down.

## 2017-05-24 NOTE — ED Notes (Signed)
Unable to obtain patient's departure VS and/or E-signature because patient has left hospital.

## 2017-05-24 NOTE — ED Notes (Signed)
Pt. States she was here yesterday for toothache.  Pt. States bottom lt. Molar is infected.  Pt. States no decrease in pain from yesterday.  Pt. States "Its more swollen today than yesterday".

## 2017-05-24 NOTE — ED Notes (Signed)
Pt. Going home with friend 

## 2017-05-24 NOTE — ED Triage Notes (Signed)
Patient ambulatory to triage with steady gait, without difficulty or distress noted; pt seen yesterday for left lower dental pain; here with worsening symptoms

## 2017-05-24 NOTE — ED Provider Notes (Signed)
[facility] Emergency Department Provider Note   ____________________________________________   First MD Initiated Contact with Patient 05/24/17 0505     (approximate)  I have reviewed the triage vital signs and the nursing notes.   HISTORY  Chief Complaint Dental Pain    HPI Meghan Rollins is a 36 y.o. female who returns to the ED from home with a chief complaint of dentalgia. Patient was seen last night for pain to her left lower gum; started on Keflex andEtodolac. States her pain is uncontrolled and her tooth hurts worse than it did last night. Feels like gum swelling has increased.Denies associated fever, chills, chest pain, shortness breath, abdominal pain, nausea, vomiting.Has a dentist appointment scheduled for today but states the pain was unbearable.   Past medical history None for diabetes  There are no active problems to display for this patient.   Past Surgical History:  Procedure Laterality Date  . HERNIA REPAIR    . TUBAL LIGATION      Prior to Admission medications   Medication Sig Start Date End Date Taking? Authorizing Provider  amoxicillin (AMOXIL) 500 MG capsule Take 1 capsule (500 mg total) by mouth 3 (three) times daily. 01/19/16   Joni Reining, PA-C  cephALEXin (KEFLEX) 500 MG capsule Take 1 capsule (500 mg total) by mouth 4 (four) times daily. 05/23/17 06/02/17  Rebecka Apley, MD  etodolac (LODINE) 200 MG capsule Take 1 capsule (200 mg total) by mouth every 8 (eight) hours. 05/23/17   Rebecka Apley, MD  ibuprofen (ADVIL,MOTRIN) 800 MG tablet Take 1 tablet (800 mg total) by mouth every 8 (eight) hours as needed. 01/19/16   Joni Reining, PA-C  ketorolac (TORADOL) 10 MG tablet Take 1 tablet (10 mg total) by mouth every 8 (eight) hours. 03/18/15   Menshew, Charlesetta Ivory, PA-C  lidocaine (XYLOCAINE) 2 % solution Use as directed 5 mLs in the mouth or throat every 6 (six) hours as needed for mouth pain. 01/19/16   Joni Reining, PA-C  oxyCODONE-acetaminophen (ROXICET) 5-325 MG per tablet Take 1 tablet by mouth every 8 (eight) hours as needed. 03/18/15   Menshew, Charlesetta Ivory, PA-C  traMADol (ULTRAM) 50 MG tablet Take 1 tablet (50 mg total) by mouth every 6 (six) hours as needed. 05/24/17   Irean Hong, MD    Allergies Vicodin [hydrocodone-acetaminophen]  No family history on file.  Social History Social History  Substance Use Topics  . Smoking status: Current Every Day Smoker  . Smokeless tobacco: Never Used  . Alcohol use Yes     Comment: occ    Review of Systems  Constitutional: No fever/chills. Eyes: No visual changes. ENT: Positive for dental pain. No sore throat. Cardiovascular: Denies chest pain. Respiratory: Denies shortness of breath. Gastrointestinal: No abdominal pain.  No nausea, no vomiting.  No diarrhea.  No constipation. Genitourinary: Negative for dysuria. Musculoskeletal: Negative for back pain. Skin: Negative for rash. Neurological: Negative for headaches, focal weakness or numbness.   ____________________________________________   PHYSICAL EXAM:  VITAL SIGNS: ED Triage Vitals  Enc Vitals Group     BP 05/24/17 0441 (!) 142/92     Pulse Rate 05/24/17 0441 73     Resp 05/24/17 0441 18     Temp 05/24/17 0441 98.4 F (36.9 C)     Temp Source 05/24/17 0441 Oral     SpO2 05/24/17 0441 99 %     Weight 05/24/17 0440 197 lb (89.4 kg)  Height 05/24/17 0440 5\' 9"  (1.753 m)     Head Circumference --      Peak Flow --      Pain Score 05/24/17 0440 10     Pain Loc --      Pain Edu? --      Excl. in GC? --     Constitutional: Alert and oriented. Well appearing and in mild acute distress. Eyes: Conjunctivae are normal. PERRL. EOMI. Head: Atraumatic. Nose: No congestion/rhinnorhea. Mouth/Throat: Mucous membranes are moist.  Oropharynx non-erythematous. Left lower gum edentulous with the exception of a single molar with adjacent mild soft tissue swelling. Patient  placed dental putty to her remaining left lower molar. No extraoral swelling. Neck: No stridor.   Cardiovascular: Normal rate, regular rhythm. Grossly normal heart sounds.  Good peripheral circulation. Respiratory: Normal respiratory effort.  No retractions. Lungs CTAB. Gastrointestinal: Soft and nontender. No distention. No abdominal bruits. No CVA tenderness. Musculoskeletal: No lower extremity tenderness nor edema.  No joint effusions. Neurologic:  Normal speech and language. No gross focal neurologic deficits are appreciated. No gait instability. Skin:  Skin is warm, dry and intact. No rash noted. Psychiatric: Mood and affect are normal. Speech and behavior are normal.  ____________________________________________   LABS (all labs ordered are listed, but only abnormal results are displayed)  Labs Reviewed - No data to display ____________________________________________  EKG  None ____________________________________________  RADIOLOGY  No results found.  ____________________________________________   PROCEDURES  Procedure(s) performed: None  Procedures  Critical Care performed: No  ____________________________________________   INITIAL IMPRESSION / ASSESSMENT AND PLAN / ED COURSE  Pertinent labs & imaging results that were available during my care of the patient were reviewed by me and considered in my medical decision making (see chart for details).  36 year old female who returns for dentalgia. Pain uncontrolled by Etodolac. 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 Had tramadol approximately one year ago which she tolerated well without adverse reaction. I verified this by chart review. Will prescribe a limited quantity of tramadol and patient will follow-up with her dentist today as scheduled. Strict return precautions given. Patient verbalizes understanding and agrees with plan of care.      ____________________________________________   FINAL CLINICAL  IMPRESSION(S) / ED DIAGNOSES  Final diagnoses:  Dental infection  Dentalgia      NEW MEDICATIONS STARTED DURING THIS VISIT:  New Prescriptions   TRAMADOL (ULTRAM) 50 MG TABLET    Take 1 tablet (50 mg total) by mouth every 6 (six) hours as needed.     Note:  This document was prepared using Dragon voice recognition software and may include unintentional dictation errors.    Irean Hong, MD 05/24/17 [phone]

## 2019-07-28 ENCOUNTER — Other Ambulatory Visit: Payer: Self-pay

## 2019-07-28 ENCOUNTER — Emergency Department
Admission: EM | Admit: 2019-07-28 | Discharge: 2019-07-28 | Disposition: A | Discharge status: Home / Self Care | Payer: Self-pay | Attending: Emergency Medicine | Admitting: Emergency Medicine

## 2019-07-28 ENCOUNTER — Encounter: Payer: Self-pay | Admitting: Emergency Medicine

## 2019-07-28 DIAGNOSIS — F17210 Nicotine dependence, cigarettes, uncomplicated: Secondary | ICD-10-CM | POA: Insufficient documentation

## 2019-07-28 DIAGNOSIS — K029 Dental caries, unspecified: Secondary | ICD-10-CM | POA: Insufficient documentation

## 2019-07-28 DIAGNOSIS — K047 Periapical abscess without sinus: Secondary | ICD-10-CM | POA: Insufficient documentation

## 2019-07-28 MED ORDER — AMOXICILLIN 500 MG PO CAPS: 500.0000 mg | ORAL_CAPSULE | Freq: Three times a day (TID) | ORAL | 0 refills | Status: DC

## 2019-07-28 MED ORDER — TRAMADOL HCL 50 MG PO TABS: 50.0000 mg | ORAL_TABLET | Freq: Four times a day (QID) | ORAL | 0 refills | Status: AC | PRN

## 2019-07-28 MED ORDER — IBUPROFEN 600 MG PO TABS: 600.0000 mg | ORAL_TABLET | Freq: Three times a day (TID) | ORAL | 0 refills | Status: DC | PRN

## 2019-07-28 NOTE — ED Provider Notes (Signed)
[facility] Emergency Department Provider Note   ____________________________________________   First MD Initiated Contact with Patient 07/28/19 1113     (approximate)  I have reviewed the triage vital signs and the nursing notes.   HISTORY  Chief Complaint Dental Pain    HPI Meghan Rollins is a 38 y.o. female patient presents with dental pain and facial edema for 2 days.  Patient has a history of dental disease and has not follow-up with dentist in the past 2 years.  Patient rates her pain as a 7/10.  Patient described the pain is "achy".  No palliative measure for complaint.         History reviewed. No pertinent past medical history.  There are no active problems to display for this patient.   Past Surgical History:  Procedure Laterality Date  . HERNIA REPAIR    . TUBAL LIGATION      Prior to Admission medications   Medication Sig Start Date End Date Taking? Authorizing Provider  amoxicillin (AMOXIL) 500 MG capsule Take 1 capsule (500 mg total) by mouth 3 (three) times daily. 07/28/19   Sable Feil, PA-C  ibuprofen (ADVIL) 600 MG tablet Take 1 tablet (600 mg total) by mouth every 8 (eight) hours as needed. 07/28/19   Sable Feil, PA-C  traMADol (ULTRAM) 50 MG tablet Take 1 tablet (50 mg total) by mouth every 6 (six) hours as needed. 07/28/19 07/27/20  Sable Feil, PA-C    Allergies Vicodin [hydrocodone-acetaminophen]  No family history on file.  Social History Social History   Tobacco Use  . Smoking status: Current Every Day Smoker  . Smokeless tobacco: Never Used  Substance Use Topics  . Alcohol use: Yes    Comment: occ  . Drug use: Not on file    Review of Systems Constitutional: No fever/chills Eyes: No visual changes. ENT: No sore throat.  Dental pain. Cardiovascular: Denies chest pain. Respiratory: Denies shortness of breath. Gastrointestinal: No abdominal pain.  No nausea, no vomiting.  No diarrhea.  No  constipation. Genitourinary: Negative for dysuria. Musculoskeletal: Negative for back pain. Skin: Negative for rash. Neurological: Negative for headaches, focal weakness or numbness.   ____________________________________________   PHYSICAL EXAM:  VITAL SIGNS: ED Triage Vitals  Enc Vitals Group     BP 07/28/19 1113 (!) 137/98     Pulse Rate 07/28/19 1113 64     Resp 07/28/19 1113 18     Temp 07/28/19 1113 98.2 F (36.8 C)     Temp Source 07/28/19 1113 Oral     SpO2 07/28/19 1113 98 %     Weight 07/28/19 1114 215 lb (97.5 kg)     Height 07/28/19 1114 5\' 7"  (1.702 m)     Head Circumference --      Peak Flow --      Pain Score 07/28/19 1107 7     Pain Loc --      Pain Edu? --      Excl. in [place]? --    Constitutional: Alert and oriented. Well appearing and in no acute distress. Mouth/Throat: Mucous membranes are moist.  Oropharynx non-erythematous.  Edematous gingiva left upper molars with multiple caries. Neck: No stridor.  Hematological/Lymphatic/Immunilogical: No cervical lymphadenopathy. Cardiovascular: Normal rate, regular rhythm. Grossly normal heart sounds.  Good peripheral circulation. Respiratory: Normal respiratory effort.  No retractions. Lungs CTAB. Skin:  Skin is warm, dry and intact. No rash noted. Psychiatric: Mood and affect are normal. Speech and behavior  are normal.  ____________________________________________   LABS (all labs ordered are listed, but only abnormal results are displayed)  Labs Reviewed - No data to display ____________________________________________  EKG   ____________________________________________  RADIOLOGY  ED MD interpretation:    Official radiology report(s): No results found.  ____________________________________________   PROCEDURES  Procedure(s) performed (including Critical Care):  Procedures   ____________________________________________   INITIAL IMPRESSION / ASSESSMENT AND PLAN / ED COURSE  As part  of my medical decision making, I reviewed the following data within the electronic MEDICAL RECORD NUMBER         CHRISIE JANKOVICH was evaluated in Emergency Department on 07/28/2019 for the symptoms described in the history of present illness. She was evaluated in the context of the global COVID-19 pandemic, which necessitated consideration that the patient might be at risk for infection with the SARS-CoV-2 virus that causes COVID-19. Institutional protocols and algorithms that pertain to the evaluation of patients at risk for COVID-19 are in a state of rapid change based on information released by regulatory bodies including the CDC and federal and state organizations. These policies and algorithms were followed during the patient's care in the ED.   Patient presents with dental pain increased facial swelling for past 2 days.  Physical exam showed devitalized upper molars.  Patient given list of dental clinics for follow-up care.  Patient given discharge care instruction advised take medication as directed.      ____________________________________________   FINAL CLINICAL IMPRESSION(S) / ED DIAGNOSES  Final diagnoses:  Dental infection  Pain due to dental caries     ED Discharge Orders         Ordered    amoxicillin (AMOXIL) 500 MG capsule  3 times daily     07/28/19 1119    traMADol (ULTRAM) 50 MG tablet  Every 6 hours PRN     07/28/19 1119    ibuprofen (ADVIL) 600 MG tablet  Every 8 hours PRN     07/28/19 1119           Note:  This document was prepared using Dragon voice recognition software and may include unintentional dictation errors.    Joni Reining, PA-C 07/28/19 1124    Shaune Pollack, MD 07/28/19 [phone]

## 2019-07-28 NOTE — Discharge Instructions (Signed)
Follow-up from list of dental clinics provided you discharge instructions. OPTIONS FOR DENTAL FOLLOW UP CARE  [facility] [place] [email or web address] [phone])  [facility] [phone])  [place] [phone])  [place] [phone])  [facility] [phone]) This clinic caters to the indigent population and is on a lottery system. Location: [facility], [place], [place], [place] Clinic Hours: Wednesdays from 6pm - 9pm, patients seen by a lottery system. For dates, call or go to [email or web address] Services: Cleanings, fillings and simple extractions. Payment Options: DENTAL WORK IS FREE OF CHARGE. Bring proof of income or support. Best way to get seen: Arrive at 5:15 pm - this is a lottery, NOT first come/first serve, so arriving earlier will not increase your chances of being seen.     [facility] [phone] Select option 1 for emergencies   Location: [facility], [place], [address], [place] Clinic Hours: No walk-ins accepted - call the day before to schedule an appointment. Check in times are 9:30 am and 1:30 pm. Services: Simple extractions, temporary fillings, pulpectomy/pulp debridement, uncomplicated abscess drainage. Payment Options: PAYMENT IS DUE AT THE TIME OF SERVICE.  Fee is usually $100-200, additional surgical procedures (e.g. abscess drainage) may be extra. Cash, checks, Visa/MasterCard accepted.  Can file Medicaid if patient is covered for dental - patient should call case worker to check. No discount for [facility] patients. Best way to get seen: MUST call the day before and get onto the schedule. Can usually be seen the next 1-2 days. No  walk-ins accepted.     [facility] [phone]   Location: [place], [place] [facility] Hours: M, W, Th, F 8am or 1:30pm, Tues 9a or 1:30 - first come/first served. Services: Simple extractions, temporary fillings, uncomplicated abscess drainage.  You do not need to be an [facility] resident. Payment Options: PAYMENT IS DUE AT THE TIME OF SERVICE. Dental insurance, otherwise sliding scale - bring proof of income or support. Depending on income and treatment needed, cost is usually $50-200. Best way to get seen: Arrive early as it is first come/first served.     [facility] [phone]   Location: [place] [facility] Hours: Mon-Thu 8a-5p Services: Most basic dental services including extractions and fillings. Payment Options: PAYMENT IS DUE AT THE TIME OF SERVICE. Sliding scale, up to 50% off - bring proof if income or support. Medicaid with dental option accepted. Best way to get seen: Call to schedule an appointment, can usually be seen within 2 weeks OR they will try to see walk-ins - show up at [place] or 2p (you may have to wait).     [facility] RESIDENTS ONLY   Location: [facility], [place] [address], [place], [place] [address] Clinic Hours: By appointment only. Monday - Thursday 8am-5pm, Friday 8am-12pm Services: Cleanings, fillings, extractions. Payment Options: PAYMENT IS DUE AT THE TIME OF SERVICE. Cash, Visa or MasterCard. Sliding scale - $30 minimum per service. Best way to get seen: Come in to office, complete packet and make an appointment - need proof of income or support monies for each household member and proof of [facility] residence. Usually takes about a month to get in.     [facility]  Services Dental Clinic [phone]   Location: [address], [facility] Hours:  Walk-in Urgent Care Dental Services are offered Monday-Friday mornings only. The numbers of emergencies accepted daily is limited to the number of providers available. Maximum 15 - Mondays, Wednesdays & Thursdays Maximum 10 - Tuesdays & Fridays Services: You do not need to be a [facility] resident to be seen for a dental emergency. Emergencies are defined as pain, swelling, abnormal bleeding, or dental trauma. Walkins will receive x-rays if needed. NOTE: Dental cleaning is not an emergency. Payment Options: PAYMENT IS DUE AT THE TIME OF SERVICE. Minimum co-pay is $40.00 for uninsured patients. Minimum co-pay is $3.00 for Medicaid with dental coverage. Dental Insurance is accepted and must be presented at time of visit. Medicare does not cover dental. Forms of payment: Cash, credit card, checks. Best way to get seen: If not previously registered with the clinic, walk-in dental registration begins at 7:15 am and is on a first come/first serve basis. If previously registered with the clinic, call to make an appointment.     [facility] [phone] [place] RESIDENTS ONLY   Location: [address] [place], [place] Clinic Hours: Mon-Thu 10a-2p Services: Extractions only! Payment Options: FREE (donations accepted) - bring proof of income or support Best way to get seen: Call and schedule an appointment OR come at 8am on the 1st Monday of every month (except for holidays) when it is first come/first served.     [facility] [phone]   Location: [address] [place], [place] Clinic Hours: Friday mornings Services, Payment Options, Best way to get seen: Call for info

## 2019-07-28 NOTE — ED Triage Notes (Signed)
Presents with possible dental abscess  Developed pain to left upper gum line 2 days ago   Swelling to face this am

## 2019-09-13 ENCOUNTER — Other Ambulatory Visit: Payer: Self-pay

## 2019-09-13 ENCOUNTER — Emergency Department: Payer: Self-pay

## 2019-09-13 ENCOUNTER — Emergency Department
Admission: EM | Admit: 2019-09-13 | Discharge: 2019-09-13 | Disposition: A | Discharge status: Home / Self Care | Payer: Self-pay | Attending: Emergency Medicine | Admitting: Emergency Medicine

## 2019-09-13 DIAGNOSIS — R079 Chest pain, unspecified: Secondary | ICD-10-CM

## 2019-09-13 DIAGNOSIS — Y9389 Activity, other specified: Secondary | ICD-10-CM | POA: Insufficient documentation

## 2019-09-13 DIAGNOSIS — Y999 Unspecified external cause status: Secondary | ICD-10-CM | POA: Insufficient documentation

## 2019-09-13 DIAGNOSIS — F17200 Nicotine dependence, unspecified, uncomplicated: Secondary | ICD-10-CM | POA: Insufficient documentation

## 2019-09-13 DIAGNOSIS — S1081XA Abrasion of other specified part of neck, initial encounter: Secondary | ICD-10-CM | POA: Insufficient documentation

## 2019-09-13 DIAGNOSIS — R0789 Other chest pain: Secondary | ICD-10-CM | POA: Insufficient documentation

## 2019-09-13 DIAGNOSIS — M25562 Pain in left knee: Secondary | ICD-10-CM | POA: Insufficient documentation

## 2019-09-13 DIAGNOSIS — M25561 Pain in right knee: Secondary | ICD-10-CM | POA: Insufficient documentation

## 2019-09-13 DIAGNOSIS — Y92410 Unspecified street and highway as the place of occurrence of the external cause: Secondary | ICD-10-CM | POA: Insufficient documentation
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
## 2019-09-13 NOTE — ED Notes (Signed)
Per significant other that was roomed with pt, pt left the hospital. Unable to get d/c vitals and d/c signature.
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
## 2020-11-25 ENCOUNTER — Encounter (HOSPITAL_COMMUNITY): Payer: Self-pay

## 2020-11-25 ENCOUNTER — Emergency Department (HOSPITAL_COMMUNITY)
Admission: EM | Admit: 2020-11-25 | Discharge: 2020-11-25 | Disposition: A | Discharge status: Home / Self Care | Payer: BC Managed Care – PPO | Attending: Emergency Medicine | Admitting: Emergency Medicine

## 2020-11-25 ENCOUNTER — Emergency Department (HOSPITAL_COMMUNITY): Payer: BC Managed Care – PPO

## 2020-11-25 DIAGNOSIS — F17210 Nicotine dependence, cigarettes, uncomplicated: Secondary | ICD-10-CM | POA: Diagnosis not present

## 2020-11-25 DIAGNOSIS — M25462 Effusion, left knee: Secondary | ICD-10-CM | POA: Insufficient documentation

## 2020-11-25 DIAGNOSIS — M25562 Pain in left knee: Secondary | ICD-10-CM | POA: Diagnosis present

## 2020-11-25 LAB — SYNOVIAL CELL COUNT + DIFF, W/ CRYSTALS
Crystals, Fluid: NONE SEEN
Lymphocytes-Synovial Fld: 15 % (ref 0–20)
Monocyte-Macrophage-Synovial Fluid: 85 % (ref 50–90)
WBC, Synovial: 208 /mm3 — ABNORMAL HIGH (ref 0–200)

## 2020-11-25 MED ORDER — LIDOCAINE-EPINEPHRINE 2 %-1:100000 IJ SOLN
20.0000 mL | Freq: Once | INTRAMUSCULAR | Status: AC
  Administered 2020-11-25: 20 mL
  Filled 2020-11-25: qty 1

## 2020-11-25 NOTE — ED Provider Notes (Signed)
[facility]   CSN: [id number] Arrival date & time: 11/25/20  8841     History Chief Complaint  Patient presents with  . Knee Pain    Meghan Rollins is a 40 y.o. female.  HPI Patient is a 40 year old female who presents the emergency department due to left knee pain and swelling.  Patient states that she started a new job about 3 months ago where she stands throughout the day and ambulates a significant amount.  About 2 weeks ago she got out of bed and noticed that her left knee was painful and swollen.  She states this worsens during the day while she is at work and alleviates at night when she elevates it.  She has been taking ibuprofen with mild short-term relief.  She states she discontinue taking ibuprofen recently because she was taking so much of it.  Patient has no other somatic complaints.  No numbness, weakness, nausea, vomiting, fevers, chills.    History reviewed. No pertinent past medical history.  There are no problems to display for this patient.   Past Surgical History:  Procedure Laterality Date  . HERNIA REPAIR    . TUBAL LIGATION       OB History   No obstetric history on file.     History reviewed. No pertinent family history.  Social History   Tobacco Use  . Smoking status: Current Every Day Smoker  . Smokeless tobacco: Never Used  Substance Use Topics  . Alcohol use: Yes    Comment: occ    Home Medications Prior to Admission medications   Medication Sig Start Date End Date Taking? Authorizing Provider  ibuprofen (ADVIL) 200 MG tablet Take 800-1,000 mg by mouth every 6 (six) hours as needed for fever, headache or mild pain.   Yes [provider]  Menthol, Topical Analgesic, (BENGAY EX) Apply 1 application topically as needed (knee pain).   Yes [provider]  polyvinyl alcohol (LIQUIFILM TEARS) 1.4 % ophthalmic solution Place 1 drop into both eyes as needed for dry eyes.   Yes  [provider]    Allergies    Vicodin [hydrocodone-acetaminophen]  Review of Systems   Review of Systems  Constitutional: Negative for chills and fever.  Gastrointestinal: Negative for nausea and vomiting.  Musculoskeletal: Positive for arthralgias and joint swelling.  Neurological: Negative for weakness and numbness.   Physical Exam Updated Vital Signs BP 115/87   Pulse 68   Temp 98.2 F (36.8 C) (Oral)   Resp 18   LMP 11/22/2020 (Approximate)   SpO2 100%   Physical Exam Vitals and nursing note reviewed.  Constitutional:      General: She is not in acute distress.    Appearance: She is well-developed.  HENT:     Head: Normocephalic and atraumatic.     Right Ear: External ear normal.     Left Ear: External ear normal.  Eyes:     General: No scleral icterus.       Right eye: No discharge.        Left eye: No discharge.     Conjunctiva/sclera: Conjunctivae normal.  Neck:     Trachea: No tracheal deviation.  Cardiovascular:     Rate and Rhythm: Normal rate.  Pulmonary:     Effort: Pulmonary effort is normal. No respiratory distress.     Breath sounds: No stridor.  Abdominal:     General: There is no distension.  Musculoskeletal:  General: Swelling and tenderness present. No deformity.     Cervical back: Neck supple.     Right lower leg: No edema.     Left lower leg: No edema.     Comments: Mild TTP noted diffusely along the anterior knee.  Moderate joint effusion this seems to be worst in the suprapatellar region.  Patient able to mildly flex and extend the left knee but this is limited due to pain and swelling.  No overlying erythema.  No increased warmth.  No overlying skin changes.  Palpable DP pulses.  Distal sensation intact.  Skin:    General: Skin is warm and dry.     Findings: No rash.  Neurological:     General: No focal deficit present.     Mental Status: She is alert and oriented to person, place, and time.     Cranial Nerves: Cranial  nerve deficit: no gross deficits.    ED Results / Procedures / Treatments   Labs (all labs ordered are listed, but only abnormal results are displayed) Labs Reviewed  SYNOVIAL CELL COUNT + DIFF, W/ CRYSTALS - Abnormal; Notable for the following components:      Result Value   WBC, Synovial 208 (*)    All other components within normal limits  BODY FLUID CULTURE  GLUCOSE, BODY FLUID OTHER  PROTEIN, BODY FLUID (OTHER)  URIC ACID, BODY FLUID    EKG None  Radiology DG Knee Complete 4 Views Left  Result Date: 11/25/2020 CLINICAL DATA:  Pain EXAM: LEFT KNEE - COMPLETE 4+ VIEW COMPARISON:  September 13, 2019 FINDINGS: Frontal, lateral, and bilateral oblique views were obtained. No appreciable fracture or dislocation. There is a sizable joint effusion. The joint spaces appear unremarkable. No erosive change. IMPRESSION: Sizable joint effusion. No fracture or dislocation. No appreciable joint space narrowing or erosion. Electronically Signed   By: Bretta Bang III M.D.   On: 11/25/2020 08:16   Procedures Procedures   Medications Ordered in ED Medications  lidocaine-EPINEPHrine (XYLOCAINE W/EPI) 2 %-1:100000 (with pres) injection 20 mL (20 mLs Infiltration Given 11/25/20 0945)    ED Course  I have reviewed the triage vital signs and the nursing notes.  Pertinent labs & imaging results that were available during my care of the patient were reviewed by me and considered in my medical decision making (see chart for details).  Clinical Course as of 11/25/20 1242  Thu Nov 25, 2020  4854 DG Knee Complete 4 Views Left IMPRESSION: Sizable joint effusion. No fracture or dislocation. No appreciable joint space narrowing or erosion. [LJ]  1003 Attempted arthrocentesis unsuccessfully.  My attending physician Dr. Cristal Deer Tegeler reattempted arthrocentesis and was able to remove about 40 cc of straw-colored fluid from the knee.  Patient notes significant improvement in her pain as  well as range of motion of the knee.  Will send samples to the lab for evaluation. [LJ]    Clinical Course User Index [LJ] Placido Sou, PA-C   MDM Rules/Calculators/A&P                          Pt is a 40 y.o. female who presents to the emergency department with left knee pain and swelling.  Labs: Synovial cell counts with differential with crystals shows yellow color, clear appearance, no crystals, synovial white blood cell count of 208, 15 lymphocytes, 85 monocytes. Uric acid is pending. Glucose is pending. Protein is pending. Culture collected.  Imaging: X-ray of the left  knee shows a sizable joint effusion.  No fracture or dislocation.  No appreciable joint space narrowing or erosion.  I, Placido Sou, PA-C, personally reviewed and evaluated these images and lab results as part of my medical decision-making.  Patient discussed with and evaluated by my attending physician Dr. Lynden Oxford.  I initially attempted arthrocentesis of the joint unsuccessfully.  He then reattempted and was able to remove a large amount of straw-colored fluid from the left knee.  Patient tolerated this procedure quite well.  Lab work today appears reassuring.  Does not appear to be gout.  Joint does not appear infected clinically or on patient's initial lab work.  She notes significant relief of her range of motion as well as pain after arthrocentesis was performed.  Feel the patient is stable for discharge at this time and she is agreeable.  She was given Ortho follow-up and she states she is going to call them later today to schedule an appointment.  Recommended ibuprofen and Tylenol as needed for management of her pain.  Patient given a knee sleeve.  RICE method.  Her questions were answered and she was amicable at the time of discharge.  Note: Portions of this report may have been transcribed using voice recognition software. Every effort was made to ensure accuracy; however, inadvertent  computerized transcription errors may be present.     Final Clinical Impression(s) / ED Diagnoses Final diagnoses:  Effusion of left knee  Acute pain of left knee    Rx / DC Orders ED Discharge Orders    None       Placido Sou, PA-C 11/25/20 1243    Tegeler, Canary Brim, MD 11/25/20 [phone]

## 2020-11-25 NOTE — Discharge Instructions (Addendum)
Like we discussed, I have given you a referral to orthopedics.  Dr. Gary Fleet number is below.  Please give them a call as soon as possible to schedule follow-up appointment.  You can continue to take Tylenol and ibuprofen for management of your knee pain.  Please wear your knee brace throughout the day while you are at work.  This compression will help with both your pain as well as swelling.  Continue to elevate the leg at night and apply ice as needed.  If your symptoms worsen, you can always return to the emergency department.  It was a pleasure to meet you.

## 2020-11-25 NOTE — Progress Notes (Signed)
Orthopedic Tech Progress Note Patient Details:  Meghan Rollins [birth year] [id number]  Ortho Devices Ortho Device/Splint Location: hinged knee brace LLE Ortho Device/Splint Interventions: Ordered,Application   Post Interventions Patient Tolerated: Well Instructions Provided: Care of device   Jennye Moccasin 11/25/2020, 12:47 PM

## 2020-11-25 NOTE — ED Triage Notes (Signed)
Pt presents with c/o left knee pain. Pt reports chronic issues with her right knee, but reports that she woke up several weeks ago and her left knee was swollen. Pt denies any injury to that knee, just reports that her vitamin D levels were abnormal when she recently had blood work done for her job.

## 2020-11-26 LAB — URIC ACID, BODY FLUID: Uric Acid Body Fluid: 4.1 mg/dL

## 2020-11-26 LAB — GLUCOSE, BODY FLUID OTHER: Glucose, Body Fluid Other: 92 mg/dL

## 2020-11-26 LAB — PROTEIN, BODY FLUID (OTHER): Total Protein, Body Fluid Other: 4.5 g/dL

## 2022-05-15 ENCOUNTER — Encounter (HOSPITAL_COMMUNITY): Payer: Self-pay

## 2022-05-15 ENCOUNTER — Other Ambulatory Visit: Payer: Self-pay

## 2022-05-15 ENCOUNTER — Emergency Department (HOSPITAL_COMMUNITY)
Admission: EM | Admit: 2022-05-15 | Discharge: 2022-05-16 | Disposition: A | Discharge status: Home / Self Care | Payer: BC Managed Care – PPO | Attending: Emergency Medicine | Admitting: Emergency Medicine

## 2022-05-15 DIAGNOSIS — W57XXXA Bitten or stung by nonvenomous insect and other nonvenomous arthropods, initial encounter: Secondary | ICD-10-CM | POA: Insufficient documentation

## 2022-05-15 DIAGNOSIS — M25572 Pain in left ankle and joints of left foot: Secondary | ICD-10-CM | POA: Insufficient documentation

## 2022-05-15 DIAGNOSIS — L03116 Cellulitis of left lower limb: Secondary | ICD-10-CM | POA: Insufficient documentation

## 2022-05-15 MED ORDER — DOXYCYCLINE HYCLATE 100 MG PO TABS
100.0000 mg | ORAL_TABLET | Freq: Once | ORAL | Status: AC
  Administered 2022-05-15: 100 mg via ORAL
  Filled 2022-05-15: qty 1

## 2022-05-15 MED ORDER — DOXYCYCLINE HYCLATE 100 MG PO CAPS
100.0000 mg | ORAL_CAPSULE | Freq: Two times a day (BID) | ORAL | 0 refills | Status: AC
Start: 2022-05-15 — End: ?

## 2022-05-15 MED ORDER — IBUPROFEN 800 MG PO TABS
800.0000 mg | ORAL_TABLET | Freq: Once | ORAL | Status: AC
  Administered 2022-05-15: 800 mg via ORAL
  Filled 2022-05-15: qty 1

## 2022-05-15 NOTE — Discharge Instructions (Addendum)

## 2022-05-15 NOTE — ED Provider Notes (Incomplete)
[facility]-EMERGENCY DEPT Provider Note   CSN: [id number] Arrival date & time: 05/15/22  2049     History {Add pertinent medical, surgical, social history, OB history to HPI:1} Chief Complaint  Patient presents with  . Insect Bite    Meghan Rollins is a 41 y.o. female presents emergency department with chief complaint of left ankle pain.  Patient states that she thinks she was bitten by a spider when she was at a camping trip with a friend in an RV out in the woods over the last several days.  She noticed an area of swelling and tenderness on the left lower leg above the lateral malleolus this past Saturday.  It came to ahead and drained pus but has not improved and is throbbing and painful to bear weight upon.  Patient also concerned because she is currently homeless and is unsure if she would be able to afford antibiotics.  She has had some associated chills today and has had nausea.  HPI     Home Medications Prior to Admission medications   Medication Sig Start Date End Date Taking? Authorizing Provider  ibuprofen (ADVIL) 200 MG tablet Take 800-1,000 mg by mouth every 6 (six) hours as needed for fever, headache or mild pain.    [provider]  Menthol, Topical Analgesic, (BENGAY EX) Apply 1 application topically as needed (knee pain).    [provider]  polyvinyl alcohol (LIQUIFILM TEARS) 1.4 % ophthalmic solution Place 1 drop into both eyes as needed for dry eyes.    [provider]      Allergies    Vicodin [hydrocodone-acetaminophen]    Review of Systems   Review of Systems  Physical Exam Updated Vital Signs BP 117/69   Pulse (!) 109   Temp 99.4 F (37.4 C)   Resp 16   Ht 5\' 9"  (1.753 m)   Wt 85.7 kg   SpO2 99%   BMI 27.91 kg/m  Physical Exam Vitals and nursing note reviewed.  Constitutional:      General: She is not in acute distress.    Appearance: She is well-developed. She is not diaphoretic.  HENT:      Head: Normocephalic and atraumatic.     Right Ear: External ear normal.     Left Ear: External ear normal.     Nose: Nose normal.     Mouth/Throat:     Mouth: Mucous membranes are moist.  Eyes:     General: No scleral icterus.    Conjunctiva/sclera: Conjunctivae normal.  Cardiovascular:     Rate and Rhythm: Normal rate and regular rhythm.     Heart sounds: Normal heart sounds. No murmur heard.    No friction rub. No gallop.  Pulmonary:     Effort: Pulmonary effort is normal. No respiratory distress.     Breath sounds: Normal breath sounds.  Abdominal:     General: Bowel sounds are normal. There is no distension.     Palpations: Abdomen is soft. There is no mass.     Tenderness: There is no abdominal tenderness. There is no guarding.  Musculoskeletal:     Cervical back: Normal range of motion.  Skin:    General: Skin is warm and dry.     Findings: Erythema present.     Comments: There is a 10 cm area of erythema and swelling of the left lower extremity with central ulceration with serous drainage.  Bedside ultrasound performed shows no collection of fluid subcutaneously.  Neurological:     Mental Status: She is alert and oriented to person, place, and time.  Psychiatric:        Behavior: Behavior normal.     ED Results / Procedures / Treatments   Labs (all labs ordered are listed, but only abnormal results are displayed) Labs Reviewed - No data to display  EKG None  Radiology No results found.  Procedures Procedures  {Document cardiac monitor, telemetry assessment procedure when appropriate:1}  Medications Ordered in ED Medications  ibuprofen (ADVIL) tablet 800 mg (has no administration in time range)  doxycycline (VIBRA-TABS) tablet 100 mg (has no administration in time range)    ED Course/ Medical Decision Making/ A&P                           Medical Decision Making Risk Prescription drug management.   ***  {Document critical care time when  appropriate:1} {Document review of labs and clinical decision tools ie heart score, Chads2Vasc2 etc:1}  {Document your independent review of radiology images, and any outside records:1} {Document your discussion with family members, caretakers, and with consultants:1} {Document social determinants of health affecting pt's care:1} {Document your decision making why or why not admission, treatments were needed:1} Final Clinical Impression(s) / ED Diagnoses Final diagnoses:  None    Rx / DC Orders ED Discharge Orders     None

## 2022-05-15 NOTE — ED Provider Notes (Signed)
[facility]-EMERGENCY DEPT Provider Note   CSN: [id number] Arrival date & time: 05/15/22  2049     History  Chief Complaint  Patient presents with   Insect Bite    Meghan Rollins is a 41 y.o. female presents emergency department with chief complaint of left ankle pain.  Patient states that she thinks she was bitten by a spider when she was at a camping trip with a friend in an RV out in the woods over the last several days.  She noticed an area of swelling and tenderness on the left lower leg above the lateral malleolus this past Saturday.  It came to ahead and drained pus but has not improved and is throbbing and painful to bear weight upon.  Patient also concerned because she is currently homeless and is unsure if she would be able to afford antibiotics.  She has had some associated chills today and has had nausea.  HPI     Home Medications Prior to Admission medications   Medication Sig Start Date End Date Taking? Authorizing Provider  ibuprofen (ADVIL) 200 MG tablet Take 800-1,000 mg by mouth every 6 (six) hours as needed for fever, headache or mild pain.    [provider]  Menthol, Topical Analgesic, (BENGAY EX) Apply 1 application topically as needed (knee pain).    [provider]  polyvinyl alcohol (LIQUIFILM TEARS) 1.4 % ophthalmic solution Place 1 drop into both eyes as needed for dry eyes.    [provider]      Allergies    Vicodin [hydrocodone-acetaminophen]    Review of Systems   Review of Systems  Physical Exam Updated Vital Signs BP 117/69   Pulse (!) 109   Temp 99.4 F (37.4 C)   Resp 16   Ht 5\' 9"  (1.753 m)   Wt 85.7 kg   SpO2 99%   BMI 27.91 kg/m  Physical Exam Vitals and nursing note reviewed.  Constitutional:      General: She is not in acute distress.    Appearance: She is well-developed. She is not diaphoretic.  HENT:     Head: Normocephalic and atraumatic.     Right Ear: External ear  normal.     Left Ear: External ear normal.     Nose: Nose normal.     Mouth/Throat:     Mouth: Mucous membranes are moist.  Eyes:     General: No scleral icterus.    Conjunctiva/sclera: Conjunctivae normal.  Cardiovascular:     Rate and Rhythm: Normal rate and regular rhythm.     Heart sounds: Normal heart sounds. No murmur heard.    No friction rub. No gallop.  Pulmonary:     Effort: Pulmonary effort is normal. No respiratory distress.     Breath sounds: Normal breath sounds.  Abdominal:     General: Bowel sounds are normal. There is no distension.     Palpations: Abdomen is soft. There is no mass.     Tenderness: There is no abdominal tenderness. There is no guarding.  Musculoskeletal:     Cervical back: Normal range of motion.  Skin:    General: Skin is warm and dry.     Findings: Erythema present.     Comments: There is a 10 cm area of erythema and swelling of the left lower extremity with central ulceration with serous drainage.  Bedside ultrasound performed shows no collection of fluid subcutaneously.  Neurological:     Mental Status: She  is alert and oriented to person, place, and time.  Psychiatric:        Behavior: Behavior normal.    ED Results / Procedures / Treatments   Labs (all labs ordered are listed, but only abnormal results are displayed) Labs Reviewed - No data to display  EKG None  Radiology No results found.  Procedures Procedures    Medications Ordered in ED Medications  ibuprofen (ADVIL) tablet 800 mg (has no administration in time range)  doxycycline (VIBRA-TABS) tablet 100 mg (has no administration in time range)    ED Course/ Medical Decision Making/ A&P                           Medical Decision Making Meghan Rollins presents with Left leg pain  Examination is consistent with diagnosis of cellulitis.  There is no area of currently drain-able abscess. The presentation of Meghan Rollins is NOT consistent with necrotizing fascitis  or osteomyolitis. There is no evidence of retained foreign body, or neurovascular or tendon injury. The presentation of Meghan Rollins is NOT consistent with sepsis and/or bacteremia. Meghan Rollins meets outpatient criteria for treatment and is sent home on empiric antibiotics covering the relevant bacteria, including MRSA if applicable.  Strict return and follow-up precautions have been given personally by me or by detailed written instruction verbalized by nursing staff using the teach back method to patient/family/caretaker.  Data Reviewed/Counseling: I have reviewed the patient's vital signs, nursing notes, and other relevant tests/information. I had a detailed discussion regarding the historical points, exam findings, and any diagnostic results supporting the discharge diagnosis. I also discussed the need for outpatient follow-up and the need to return to the ED if symptoms worsen or if there are any questions or concerns that arise at home.    Risk Prescription drug management.           Final Clinical Impression(s) / ED Diagnoses Final diagnoses:  Cellulitis of left lower extremity    Rx / DC Orders ED Discharge Orders     None         Arthor Captain, PA-C 05/16/22 0330    Sloan Leiter, DO 05/16/22 0800

## 2022-05-15 NOTE — ED Triage Notes (Signed)
Pt has an insect bite to her left ankle that is draining and red.

## 2022-10-16 DIAGNOSIS — J811 Chronic pulmonary edema: Secondary | ICD-10-CM | POA: Diagnosis not present

## 2022-10-16 DIAGNOSIS — R1011 Right upper quadrant pain: Secondary | ICD-10-CM | POA: Diagnosis not present

## 2022-10-16 DIAGNOSIS — K828 Other specified diseases of gallbladder: Secondary | ICD-10-CM | POA: Diagnosis not present

## 2022-10-16 DIAGNOSIS — R609 Edema, unspecified: Secondary | ICD-10-CM | POA: Diagnosis not present

## 2022-10-16 DIAGNOSIS — R0989 Other specified symptoms and signs involving the circulatory and respiratory systems: Secondary | ICD-10-CM | POA: Diagnosis not present

## 2022-10-16 DIAGNOSIS — E0591 Thyrotoxicosis, unspecified with thyrotoxic crisis or storm: Secondary | ICD-10-CM | POA: Diagnosis not present

## 2022-10-16 DIAGNOSIS — R9431 Abnormal electrocardiogram [ECG] [EKG]: Secondary | ICD-10-CM | POA: Diagnosis not present

## 2022-10-16 DIAGNOSIS — R079 Chest pain, unspecified: Secondary | ICD-10-CM | POA: Diagnosis not present

## 2022-10-16 DIAGNOSIS — R634 Abnormal weight loss: Secondary | ICD-10-CM | POA: Diagnosis not present

## 2022-10-17 DIAGNOSIS — E0591 Thyrotoxicosis, unspecified with thyrotoxic crisis or storm: Secondary | ICD-10-CM | POA: Diagnosis not present

## 2022-10-18 ENCOUNTER — Encounter: Payer: Self-pay | Admitting: Emergency Medicine

## 2022-10-18 ENCOUNTER — Emergency Department
Admission: EM | Admit: 2022-10-18 | Discharge: 2022-10-18 | Discharge status: Against Medical Advice | Payer: Medicaid Other | Attending: Emergency Medicine | Admitting: Emergency Medicine

## 2022-10-18 DIAGNOSIS — R002 Palpitations: Secondary | ICD-10-CM | POA: Diagnosis not present

## 2022-10-18 DIAGNOSIS — I509 Heart failure, unspecified: Secondary | ICD-10-CM | POA: Diagnosis not present

## 2022-10-18 DIAGNOSIS — Z5321 Procedure and treatment not carried out due to patient leaving prior to being seen by health care provider: Secondary | ICD-10-CM | POA: Diagnosis not present

## 2022-10-18 DIAGNOSIS — R0602 Shortness of breath: Secondary | ICD-10-CM | POA: Diagnosis not present

## 2022-10-18 DIAGNOSIS — E0591 Thyrotoxicosis, unspecified with thyrotoxic crisis or storm: Secondary | ICD-10-CM | POA: Diagnosis not present

## 2022-10-18 DIAGNOSIS — R1013 Epigastric pain: Secondary | ICD-10-CM | POA: Diagnosis not present

## 2022-10-18 NOTE — ED Notes (Signed)
EDT to obtain labs - Pt stating that she just had labs done yesterday at [facility] and is wanting to back there. Pt seen leaving the ED.

## 2022-10-18 NOTE — ED Triage Notes (Signed)
Pt presents via POV with complaints of Palpitations. Pt states she was dx with a "thyroid condition" unsure of the name at [facility]. Pt was seen there yesterday but left due to the long wait times. No meds taken PTA. Denies CP or SOB.

## 2022-10-19 DIAGNOSIS — R002 Palpitations: Secondary | ICD-10-CM | POA: Diagnosis not present

## 2022-10-19 DIAGNOSIS — I509 Heart failure, unspecified: Secondary | ICD-10-CM | POA: Diagnosis not present

## 2022-10-19 DIAGNOSIS — E0590 Thyrotoxicosis, unspecified without thyrotoxic crisis or storm: Secondary | ICD-10-CM | POA: Diagnosis not present

## 2022-10-19 DIAGNOSIS — R1013 Epigastric pain: Secondary | ICD-10-CM | POA: Diagnosis not present

## 2022-10-19 DIAGNOSIS — R0602 Shortness of breath: Secondary | ICD-10-CM | POA: Diagnosis not present

## 2022-10-19 DIAGNOSIS — E0591 Thyrotoxicosis, unspecified with thyrotoxic crisis or storm: Secondary | ICD-10-CM | POA: Diagnosis not present

## 2022-10-19 DIAGNOSIS — I340 Nonrheumatic mitral (valve) insufficiency: Secondary | ICD-10-CM | POA: Diagnosis not present

## 2022-10-19 DIAGNOSIS — F419 Anxiety disorder, unspecified: Secondary | ICD-10-CM | POA: Diagnosis not present

## 2022-10-19 DIAGNOSIS — I517 Cardiomegaly: Secondary | ICD-10-CM | POA: Diagnosis not present

## 2022-10-19 DIAGNOSIS — J811 Chronic pulmonary edema: Secondary | ICD-10-CM | POA: Diagnosis not present

## 2022-10-20 DIAGNOSIS — R002 Palpitations: Secondary | ICD-10-CM | POA: Diagnosis not present

## 2022-10-20 DIAGNOSIS — F419 Anxiety disorder, unspecified: Secondary | ICD-10-CM | POA: Diagnosis not present

## 2022-10-20 DIAGNOSIS — E0591 Thyrotoxicosis, unspecified with thyrotoxic crisis or storm: Secondary | ICD-10-CM | POA: Diagnosis not present

## 2022-10-21 DIAGNOSIS — I509 Heart failure, unspecified: Secondary | ICD-10-CM | POA: Diagnosis not present

## 2023-01-29 DIAGNOSIS — E0500 Thyrotoxicosis with diffuse goiter without thyrotoxic crisis or storm: Secondary | ICD-10-CM | POA: Diagnosis not present

## 2023-04-18 DIAGNOSIS — E0500 Thyrotoxicosis with diffuse goiter without thyrotoxic crisis or storm: Secondary | ICD-10-CM | POA: Diagnosis not present

## 2023-04-18 DIAGNOSIS — E0591 Thyrotoxicosis, unspecified with thyrotoxic crisis or storm: Secondary | ICD-10-CM | POA: Diagnosis not present

## 2023-05-16 DIAGNOSIS — E0500 Thyrotoxicosis with diffuse goiter without thyrotoxic crisis or storm: Secondary | ICD-10-CM | POA: Diagnosis not present

## 2023-06-12 DIAGNOSIS — E0500 Thyrotoxicosis with diffuse goiter without thyrotoxic crisis or storm: Secondary | ICD-10-CM | POA: Diagnosis not present

## 2023-07-06 DIAGNOSIS — E0500 Thyrotoxicosis with diffuse goiter without thyrotoxic crisis or storm: Secondary | ICD-10-CM | POA: Diagnosis not present

## 2023-07-09 ENCOUNTER — Other Ambulatory Visit: Payer: Self-pay

## 2023-07-09 ENCOUNTER — Encounter (HOSPITAL_COMMUNITY): Payer: Self-pay

## 2023-07-09 ENCOUNTER — Emergency Department (HOSPITAL_COMMUNITY)
Admission: EM | Admit: 2023-07-09 | Discharge: 2023-07-09 | Disposition: A | Discharge status: Home / Self Care | Payer: 59 | Attending: Emergency Medicine | Admitting: Emergency Medicine

## 2023-07-09 DIAGNOSIS — R202 Paresthesia of skin: Secondary | ICD-10-CM | POA: Insufficient documentation

## 2023-07-09 LAB — CBC WITH DIFFERENTIAL/PLATELET
Abs Immature Granulocytes: 0.03 10*3/uL (ref 0.00–0.07)
Basophils Absolute: 0 10*3/uL (ref 0.0–0.1)
Basophils Relative: 0 %
Eosinophils Absolute: 0.2 10*3/uL (ref 0.0–0.5)
Eosinophils Relative: 3 %
HCT: 38.5 % (ref 36.0–46.0)
Hemoglobin: 13 g/dL (ref 12.0–15.0)
Immature Granulocytes: 0 %
Lymphocytes Relative: 42 %
Lymphs Abs: 3 10*3/uL (ref 0.7–4.0)
MCH: 29.5 pg (ref 26.0–34.0)
MCHC: 33.8 g/dL (ref 30.0–36.0)
MCV: 87.5 fL (ref 80.0–100.0)
Monocytes Absolute: 0.6 10*3/uL (ref 0.1–1.0)
Monocytes Relative: 8 %
Neutro Abs: 3.3 10*3/uL (ref 1.7–7.7)
Neutrophils Relative %: 47 %
Platelets: 248 10*3/uL (ref 150–400)
RBC: 4.4 MIL/uL (ref 3.87–5.11)
RDW: 14.6 % (ref 11.5–15.5)
WBC: 7.1 10*3/uL (ref 4.0–10.5)
nRBC: 0 % (ref 0.0–0.2)

## 2023-07-09 NOTE — ED Notes (Signed)
Patient left stating "I'm going to [facility] for better care." Refused to sign AMA forms. Patient husband present. MD made aware. Patient explained risk and benefits of receiving care, and patient continues to refuse and walk out.

## 2023-07-09 NOTE — ED Provider Notes (Cosign Needed Addendum)
[place] EMERGENCY DEPARTMENT AT [facility] Provider Note   CSN: [id number] Arrival date & time: 07/09/23  4742     History  Chief Complaint  Patient presents with   Numbness    Meghan Rollins is a 42 y.o. female with Graves' disease who recently underwent thyroidectomy on 07/06/2023, currently taking calcitriol and levothyroxine, presents with concern for numbness that has been traveling in certain parts of her body for 3 days.  States she originally felt some numbness in her legs bilaterally, then this went away and she felt some in her hands.  Now she describes more pressure and tingling in all parts of her face.  Denies any slurred speech, facial droop, weakness.   HPI     Home Medications Prior to Admission medications   Medication Sig Start Date End Date Taking? Authorizing Provider  levothyroxine (SYNTHROID) 25 MCG tablet Take 25 mcg by mouth daily before breakfast.   Yes [provider]  doxycycline (VIBRAMYCIN) 100 MG capsule Take 1 capsule (100 mg total) by mouth 2 (two) times daily. One po bid x 7 days 05/15/22   Arthor Captain, PA-C  ibuprofen (ADVIL) 200 MG tablet Take 800-1,000 mg by mouth every 6 (six) hours as needed for fever, headache or mild pain.    [provider]  Menthol, Topical Analgesic, (BENGAY EX) Apply 1 application topically as needed (knee pain).    [provider]  polyvinyl alcohol (LIQUIFILM TEARS) 1.4 % ophthalmic solution Place 1 drop into both eyes as needed for dry eyes.    [provider]      Allergies    Vicodin [hydrocodone-acetaminophen]    Review of Systems   Review of Systems  Neurological:  Positive for numbness.    Physical Exam Updated Vital Signs BP (!) 134/42   Pulse 69   Resp (!) 33   Ht 5\' 9"  (1.753 m)   Wt 72.6 kg   SpO2 100%   BMI 23.63 kg/m  Physical Exam Vitals and nursing note reviewed.  Constitutional:      General: She is not in acute distress.     Appearance: She is well-developed.  HENT:     Head: Normocephalic and atraumatic.  Eyes:     Conjunctiva/sclera: Conjunctivae normal.  Cardiovascular:     Rate and Rhythm: Normal rate and regular rhythm.     Heart sounds: No murmur heard. Pulmonary:     Effort: Pulmonary effort is normal. No respiratory distress.     Breath sounds: Normal breath sounds.  Abdominal:     Palpations: Abdomen is soft.     Tenderness: There is no abdominal tenderness.  Musculoskeletal:        General: No swelling.     Cervical back: Neck supple.  Skin:    General: Skin is warm and dry.     Capillary Refill: Capillary refill takes less than 2 seconds.  Neurological:     Mental Status: She is alert.     Comments: Cranial nerves 3,4,6, intact with EOM Cranial nerve 5 intact, sensation intact and equal in V1, V2, V3 distributions When trying to assess cranial nerve 7 with eyebrow raise, smile, puff out cheeks, patient not giving good effort with these actions. But when talking no facial droop noted. Cranial nerve 8-12 intact  5/5 strength in the upper and lower extremities bilaterally  No pronator drift of the upper or lower extremities bilaterally  Psychiatric:        Mood and Affect:  Mood normal.     ED Results / Procedures / Treatments   Labs (all labs ordered are listed, but only abnormal results are displayed) Labs Reviewed  CBC WITH DIFFERENTIAL/PLATELET  COMPREHENSIVE METABOLIC PANEL  T4, FREE  T3, FREE  MAGNESIUM  TSH  CBG MONITORING, ED    EKG None  Radiology No results found.  Procedures Procedures    Medications Ordered in ED Medications - No data to display  ED Course/ Medical Decision Making/ A&P                                 Medical Decision Making Amount and/or Complexity of Data Reviewed Labs: ordered.   42 y.o. female with pertinent past medical history of Graves' disease with recent thyroidectomy on 10//24 presents to the ED for concern of episodic  numbness that went from legs, arms, and now face, started 3 days ago after her thyroidectomy  Differential diagnosis includes but is not limited to hyperthyroidism, hypothyroidism, electrolyte abnormality, arrhythmia, Guillian barre, myasthenia gravis, radiculopathy, Bell's palsy  ED Course:  Patient overall well-appearing, vital signs all within normal limits upon initial evaluation.  Patient reports some bilateral numbness that has been going on since the day after her thyroidectomy.  Reports she first noticed it in her feet bilaterally, then she had some in her arms bilaterally.  Now she reports the arm and feet numbness have resolved, but is now having some pressure in tingling in all parts of her face.  Patient has intact and equal sensation in the V1, V2, V3 distributions of the face bilaterally, intact and equal sensation in the upper and lower extremities bilaterally.  No facial droop, slurred speech, upper or lower extremity weakness. I was notified that patient eloped from the facility.  I was not able to discuss the risks of leaving with the patient.  I canceled the labs that were in progress.  Lab Tests: I Ordered, and personally interpreted labs.  The pertinent results include:   CBC without leukocytosis TSH, T4, T3, CMP pending  Cardiac Monitoring: / EKG: The patient was maintained on a cardiac monitor.  I personally viewed and interpreted the cardiac monitored which showed an underlying rhythm of: Normal sinus rhythm with prolonged QT and PR interval   External records from outside source obtained and reviewed including [facility] note from 07/06/2023 for thyroidectomy   Co morbidities that complicate the patient evaluation  Graves' disease with recent thyroidectomy             Final Clinical Impression(s) / ED Diagnoses Final diagnoses:  None    Rx / DC Orders ED Discharge Orders     None         Arabella Merles, PA-C 07/09/23 0808    Arabella Merles,  PA-C 07/09/23 0809    Derwood Kaplan, MD 07/10/23 [id number]

## 2023-07-09 NOTE — ED Notes (Signed)
Pt father called then hung up.

## 2023-07-09 NOTE — ED Notes (Signed)
Pt refused labs. Stated she is checking out and going to go to [facility].

## 2023-07-09 NOTE — ED Triage Notes (Signed)
  Pt. Coming in from home. C/O stroke like symptoms. Decreased sensation and numbness in all extremities and face. Started Saturday night have progressively gotten worse. Woke up this morning to facial muscles not performing as normal could not make kiss face and unable to raise eyebrows. Unable to identify which extremity is being touched with eyes closed. She had thyroid removed on thyroid removed on Friday and felt good post op. Pt. Does not feel like she has ha, but says she has extreme pressure in face. Pt. Is running hypertensive 150/110. Does not have hx of htn.  Upon arrival from [facility] , pt was profusely diaphoretic.  EMS VS HR 72 Bp 150/110 Bs 127 12 LEAD UNREMARKABLE  20 LAC  GCS 15  Pt. Takes calcium supplements.

## 2023-07-10 LAB — T3, FREE: T3, Free: 1.7 pg/mL — ABNORMAL LOW (ref 2.0–4.4)

## 2023-12-04 DIAGNOSIS — E0500 Thyrotoxicosis with diffuse goiter without thyrotoxic crisis or storm: Secondary | ICD-10-CM | POA: Diagnosis not present

## 2023-12-04 DIAGNOSIS — K029 Dental caries, unspecified: Secondary | ICD-10-CM | POA: Diagnosis not present

## 2024-02-01 DIAGNOSIS — E890 Postprocedural hypothyroidism: Secondary | ICD-10-CM | POA: Diagnosis not present

## 2024-02-01 DIAGNOSIS — E2089 Other specified hypoparathyroidism: Secondary | ICD-10-CM | POA: Diagnosis not present

## 2024-06-13 DIAGNOSIS — E209 Hypoparathyroidism, unspecified: Secondary | ICD-10-CM | POA: Diagnosis not present

## 2024-06-13 DIAGNOSIS — E0500 Thyrotoxicosis with diffuse goiter without thyrotoxic crisis or storm: Secondary | ICD-10-CM | POA: Diagnosis not present
